# Patient Record
Sex: Female | Born: 1980 | Race: White | Hispanic: Yes | Marital: Married | Smoking: Never smoker
Health system: Southern US, Community
[De-identification: ages and names within clinical notes are randomized; demographics above are authoritative.]

## PROBLEM LIST (undated history)

## (undated) DIAGNOSIS — G43009 Migraine without aura, not intractable, without status migrainosus: Secondary | ICD-10-CM

## (undated) HISTORY — DX: Migraine without aura, not intractable, without status migrainosus: G43.009

## (undated) HISTORY — PX: TUBAL LIGATION: SHX77

---

## 2004-10-22 ENCOUNTER — Emergency Department (HOSPITAL_COMMUNITY): Admission: EM | Admit: 2004-10-22 | Discharge: 2004-10-22 | Payer: Self-pay | Admitting: Family Medicine

## 2004-10-24 ENCOUNTER — Emergency Department (HOSPITAL_COMMUNITY): Admission: EM | Admit: 2004-10-24 | Discharge: 2004-10-24 | Payer: Self-pay | Admitting: Family Medicine

## 2004-11-27 ENCOUNTER — Other Ambulatory Visit: Admission: RE | Admit: 2004-11-27 | Discharge: 2004-11-27 | Payer: Self-pay | Admitting: Obstetrics and Gynecology

## 2005-05-22 ENCOUNTER — Encounter: Admission: RE | Admit: 2005-05-22 | Discharge: 2005-05-22 | Payer: Self-pay | Admitting: Obstetrics and Gynecology

## 2005-06-03 ENCOUNTER — Inpatient Hospital Stay (HOSPITAL_COMMUNITY): Admission: AD | Admit: 2005-06-03 | Discharge: 2005-06-07 | Payer: Self-pay | Admitting: Obstetrics & Gynecology

## 2005-06-03 ENCOUNTER — Encounter (INDEPENDENT_AMBULATORY_CARE_PROVIDER_SITE_OTHER): Payer: Self-pay | Admitting: *Deleted

## 2006-02-12 ENCOUNTER — Emergency Department (HOSPITAL_COMMUNITY): Admission: EM | Admit: 2006-02-12 | Discharge: 2006-02-12 | Payer: Self-pay | Admitting: Family Medicine

## 2006-02-12 ENCOUNTER — Ambulatory Visit (HOSPITAL_COMMUNITY): Admission: RE | Admit: 2006-02-12 | Discharge: 2006-02-12 | Payer: Self-pay | Admitting: Family Medicine

## 2007-04-13 ENCOUNTER — Ambulatory Visit (HOSPITAL_COMMUNITY): Admission: RE | Admit: 2007-04-13 | Discharge: 2007-04-13 | Payer: Self-pay | Admitting: Obstetrics and Gynecology

## 2007-04-13 ENCOUNTER — Encounter (INDEPENDENT_AMBULATORY_CARE_PROVIDER_SITE_OTHER): Payer: Self-pay | Admitting: Obstetrics and Gynecology

## 2008-01-24 ENCOUNTER — Emergency Department (HOSPITAL_COMMUNITY): Admission: EM | Admit: 2008-01-24 | Discharge: 2008-01-24 | Payer: Self-pay | Admitting: Family Medicine

## 2008-02-09 ENCOUNTER — Emergency Department (HOSPITAL_COMMUNITY): Admission: EM | Admit: 2008-02-09 | Discharge: 2008-02-09 | Payer: Self-pay | Admitting: Emergency Medicine

## 2008-11-24 ENCOUNTER — Ambulatory Visit: Payer: Self-pay | Admitting: Family Medicine

## 2008-11-24 DIAGNOSIS — E669 Obesity, unspecified: Secondary | ICD-10-CM

## 2008-11-24 DIAGNOSIS — R519 Headache, unspecified: Secondary | ICD-10-CM | POA: Insufficient documentation

## 2008-11-24 DIAGNOSIS — Z87442 Personal history of urinary calculi: Secondary | ICD-10-CM | POA: Insufficient documentation

## 2008-11-24 DIAGNOSIS — R51 Headache: Secondary | ICD-10-CM | POA: Insufficient documentation

## 2008-11-24 DIAGNOSIS — K589 Irritable bowel syndrome without diarrhea: Secondary | ICD-10-CM | POA: Insufficient documentation

## 2008-11-24 DIAGNOSIS — Z8742 Personal history of other diseases of the female genital tract: Secondary | ICD-10-CM | POA: Insufficient documentation

## 2008-11-25 DIAGNOSIS — E7800 Pure hypercholesterolemia, unspecified: Secondary | ICD-10-CM

## 2008-11-25 LAB — CONVERTED CEMR LAB
ALT: 18 units/L (ref 0–35)
AST: 18 units/L (ref 0–37)
Alkaline Phosphatase: 83 units/L (ref 39–117)
Bilirubin, Direct: 0 mg/dL (ref 0.0–0.3)
Chloride: 106 meq/L (ref 96–112)
GFR calc non Af Amer: 125.81 mL/min (ref >60)
Potassium: 4.3 meq/L (ref 3.5–5.1)
Sodium: 140 meq/L (ref 135–145)
TSH: 1.98 microintl units/mL (ref 0.35–5.50)
Total CHOL/HDL Ratio: 5
Total Protein: 7.2 g/dL (ref 6.0–8.3)
Triglycerides: 151 mg/dL — ABNORMAL HIGH (ref 0.0–149.0)
VLDL: 30.2 mg/dL (ref 0.0–40.0)

## 2008-12-05 ENCOUNTER — Telehealth: Payer: Self-pay | Admitting: Family Medicine

## 2009-04-19 ENCOUNTER — Ambulatory Visit: Payer: Self-pay | Admitting: Family Medicine

## 2009-04-19 DIAGNOSIS — B353 Tinea pedis: Secondary | ICD-10-CM

## 2009-04-19 DIAGNOSIS — L258 Unspecified contact dermatitis due to other agents: Secondary | ICD-10-CM | POA: Insufficient documentation

## 2009-04-19 LAB — CONVERTED CEMR LAB: KOH Prep: POSITIVE

## 2009-07-27 ENCOUNTER — Ambulatory Visit: Payer: Self-pay | Admitting: Internal Medicine

## 2009-08-04 ENCOUNTER — Ambulatory Visit: Payer: Self-pay | Admitting: Family Medicine

## 2009-08-17 ENCOUNTER — Encounter: Payer: Self-pay | Admitting: Family Medicine

## 2009-08-17 ENCOUNTER — Ambulatory Visit (HOSPITAL_COMMUNITY): Admission: RE | Admit: 2009-08-17 | Discharge: 2009-08-17 | Payer: Self-pay | Admitting: Family Medicine

## 2009-08-17 ENCOUNTER — Ambulatory Visit: Payer: Self-pay | Admitting: Internal Medicine

## 2009-08-17 ENCOUNTER — Ambulatory Visit: Payer: Self-pay

## 2009-12-29 ENCOUNTER — Ambulatory Visit: Payer: Self-pay | Admitting: Family Medicine

## 2009-12-29 DIAGNOSIS — R5383 Other fatigue: Secondary | ICD-10-CM

## 2009-12-29 DIAGNOSIS — R42 Dizziness and giddiness: Secondary | ICD-10-CM | POA: Insufficient documentation

## 2009-12-29 DIAGNOSIS — R110 Nausea: Secondary | ICD-10-CM

## 2009-12-29 DIAGNOSIS — R5381 Other malaise: Secondary | ICD-10-CM | POA: Insufficient documentation

## 2009-12-29 LAB — CONVERTED CEMR LAB
Bilirubin Urine: NEGATIVE
Glucose, Urine, Semiquant: NEGATIVE
Nitrite: NEGATIVE
Urobilinogen, UA: 0.2

## 2010-01-05 LAB — CONVERTED CEMR LAB
Albumin: 4.2 g/dL (ref 3.5–5.2)
Alkaline Phosphatase: 77 units/L (ref 39–117)
Basophils Absolute: 0.1 10*3/uL (ref 0.0–0.1)
Basophils Relative: 1.2 % (ref 0.0–3.0)
CO2: 29 meq/L (ref 19–32)
Chloride: 103 meq/L (ref 96–112)
Creatinine, Ser: 0.5 mg/dL (ref 0.4–1.2)
Eosinophils Absolute: 0.7 10*3/uL (ref 0.0–0.7)
Eosinophils Relative: 8.6 % — ABNORMAL HIGH (ref 0.0–5.0)
MCHC: 34.2 g/dL (ref 30.0–36.0)
MCV: 92.9 fL (ref 78.0–100.0)
Neutrophils Relative %: 55.2 % (ref 43.0–77.0)
Potassium: 4 meq/L (ref 3.5–5.1)
WBC: 8 10*3/uL (ref 4.5–10.5)

## 2010-09-27 NOTE — Assessment & Plan Note (Signed)
Summary: DIZZY,NAUSEA/CLE   Vital Signs:  Patient profile:   30 year old female Height:      67 inches Weight:      198.25 pounds BMI:     31.16 Temp:     98.3 degrees F oral Pulse rate:   68 / minute Pulse rhythm:   regular BP sitting:   102 / 80  (left arm) Cuff size:   regular  Vitals Entered By: Linde Gillis CMA Duncan Dull) (Dec 29, 2009 9:54 AM) CC: dizzy, nausea   History of Present Illness: In last month.Marland Kitchenepisodes intermittant of  nausea, dizzyness, not always at the same time. Nausea radom.Marland Kitchenocc in bed when turning head, or bending over. Describes dizzyness as lightheadedness, biut occ room spinning. No association with eating. BP nml at checks.Marland Kitchenalways runs low. no orthostatic component. No neuro changes..weakness, numbness  no allergies, no cold sympotms. Rare headaches...associated most often with premenstrual time, chronic...? menstrual migraine.   Overall fatigue constant in last month.  S/P BTL, has had regualr menses.  no new med, vitamins.   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  Past Surgical History: Reviewed history from 11/24/2008 and no changes required. 2008 BTL, uterine ablation with Therma  choice  NSVD x 2 , emergency C-section for prolapsed cord  Family History: Reviewed history from 11/24/2008 and no changes required. mother: HTN, DM., high chol, hypothyroid fathter: healthy 5 siblings: sister: DM, hypothyroid, sister: DM, brain tumor on pituitary MGM: pancreatic cancer age 76 no CAD less than age 72  Social History: Reviewed history from 11/24/2008 and no changes required. 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 CV:  Denies chest pain or discomfort. Resp:  Denies shortness of breath. GI:  Denies abdominal pain, bloody stools, constipation, and diarrhea; occ history of IBS...but not associated with nausea spells. . GU:  Denies abnormal vaginal bleeding and dysuria.  Physical Exam  General:  Well-developed,well-nourished,in no acute distress; alert,appropriate and cooperative throughout examination Head:  no maxillary sinus ttp Eyes:  No corneal or conjunctival inflammation noted. EOMI. Perrla. Funduscopic exam benign, without hemorrhages, exudates or papilledema. Vision grossly normal. Ears:  External ear exam shows no significant lesions or deformities.  Otoscopic examination reveals clear canals, tympanic membranes are intact bilaterally without bulging, retraction, inflammation or discharge. Hearing is grossly normal bilaterally. Nose:  External nasal examination shows no deformity or inflammation. Nasal mucosa are pink and moist without lesions or exudates. Mouth:  Oral mucosa and oropharynx without lesions or exudates.  Teeth in good repair. Neck:  no carotid bruit or thyromegaly no cervical or supraclavicular lymphadenopathy  Lungs:  Normal respiratory effort, chest expands symmetrically. Lungs are clear to auscultation, no crackles or wheezes. Heart:  Normal  rate and regular rhythm. S1 and S2 normal without gallop, murmur, click, rub or other extra sounds. Abdomen:  Bowel sounds positive,abdomen soft and non-tender without masses, organomegaly or hernias noted. Pulses:  R and L posterior tibial pulses are full and equal bilaterally  Extremities:  no edema  Neurologic:  No cranial nerve deficits noted. Station and gait are normal.  Sensory, motor and coordinative functions appear intact.   Impression & Recommendations:  Problem # 1:  NAUSEA (ICD-787.02) Eval with labs for thyroid, anemia, liver and kidney issues.Marland Kitchenalso consider DM given past history during pregnancy and other symptoms.   Orders: UA Dipstick w/o Micro (manual) (16109) Urine Pregnancy Test  (60454)  Problem # 2:  MALAISE AND FATIGUE (ICD-780.79)  Orders: TLB-BMP (Basic Metabolic Panel-BMET) (80048-METABOL) TLB-CBC Platelet - w/Differential (85025-CBCD) TLB-Hepatic/Liver Function Pnl (80076-HEPATIC) TLB-B12 + Folate Pnl (09811_91478-G95/AOZ) TLB-TSH (Thyroid Stimulating Hormone) (84443-TSH)  Problem # 3:  DIZZINESS (ICD-780.4)  Some element of vertigo..but also describes as lightheaded. Eval with labs as previously stated.    Orders: TLB-B12 + Folate Pnl (82746_82607-B12/FOL)  Prior Medications (reviewed today): None Current Allergies (reviewed today): No known allergies   Laboratory Results   Urine Tests  Date/Time Received: Dec 29, 2009 10:43 AM   Routine Urinalysis   Color: yellow Appearance: Hazy Glucose: negative   (Normal Range: Negative) Bilirubin: negative   (Normal Range: Negative) Ketone: negative   (Normal Range: Negative) Spec. Gravity: 1.025   (Normal Range: 1.003-1.035) Blood: moderate   (Normal Range: Negative) pH: 6.0   (Normal Range: 5.0-8.0) Protein: trace   (Normal Range: Negative) Urobilinogen: 0.2   (Normal Range: 0-1) Nitrite: negative   (Normal Range: Negative) Leukocyte Esterace: negative   (Normal Range: Negative)    Urine  HCG: negative

## 2011-01-08 NOTE — Op Note (Signed)
NAME:  Erica Singh     ACCOUNT NO.:  [id number]   MEDICAL RECORD NO.:  [id number]          PATIENT TYPE:  AMB   LOCATION:  SDC                           FACILITY:  [facility]   PHYSICIAN:  Dineen Kid. Rana Snare, M.D.    DATE OF BIRTH:  June 05, 1981   DATE OF PROCEDURE:  04/13/2007  DATE OF DISCHARGE:                               OPERATIVE REPORT   PREOPERATIVE DIAGNOSIS:  Multiparity with desired sterility and  menorrhagia.   POSTOPERATIVE DIAGNOSIS:  Multiparity with desired sterility and  menorrhagia.   PROCEDURE:  Laparoscopic bilateral tubal ligation by fulguration and  hysteroscopy D&C with ThermaChoice endometrial ablation.   SURGEON:  Dr. Candice Camp   ANESTHESIA:  General endotracheal.   INDICATIONS:  The patient was a 30 year old with multiparity, desires  sterility.  She presented for office Essure procedure, easily placed the  right to Essure. Left fallopian tube was unable to be placed. She  returned a month later for second attempt and unable to pass through the  second attempt. She desires definitive surgical intervention and  presents today for laparoscopic tubal ligation.  She has been having  problems with menorrhagia, which had benefitted only somewhat by being  on birth control pills and now no longer needs to take birth control  pills.  She desires endometrial ablation technique. Discussed the  different types of techniques, discussed the fact that she has Essure  placed in the right fallopian tube that the device needs to be  compatible with that such as ThermaChoice. She has read the handout and  questions were answered. The success rates complication rates were  discussed. The risks, benefits and procedures were discussed which  include but not limited to risk of infection, bleeding, damage to tubes,  ovary, bowel, bladder, risk of tubal failure quoted at 5 out of 1000.  She gives informed consent and wished to proceed.   FINDINGS:  Normal appendix, liver,  uterus, fallopian tubes, ovaries and  cul-de-sac. On D&C, normal-appearing endometrial cavity.   DESCRIPTION OF PROCEDURE:  After adequate analgesia the patient placed  in the dorsal lithotomy position.  She is sterilely prepped and draped.  Bladder sterilely drained. A Cohen tenaculum placed on the cervix.  Legs  were repositioned and infraumbilical skin incision was made.  A Veress  needle was inserted.  The abdomen was insufflated to dullness to  percussion. An 11 mm trocar was inserted.  The above findings were  noted.  The right fallopian tube was identified by the fimbriated end.  Midportion fallopian tube was grasped with bipolar cautery and was  cauterized over 2-3 cm section of fallopian tube with good thermal  cautery noted over the entirety of the portion of fallopian tube and  also loss of resistance by the ohmmeter. The left fallopian tube was  identified by the fimbriated end.  Midportion of fallopian tube was  grasped.  Cauterization was carried out over approximately 2-3 cm  section of fallopian tube with good thermal burn noted and loss-of-  resistance the ohmmeter.  Reexamination of both fallopian tubes revealed  good thermal cautery.  After careful systematic evaluation of pelvis and  abdomen and everything  appeared normal.  The abdomen was desufflated.  Trocar removed.  The infraumbilical skin incision was closed with 0  Vicryl interrupted suture and the fascia, 3-0 Vicryl Rapide subcuticular  suture.  The incision was injected with 0.25% Marcaine total of 10 mL  used.  Legs were repositioned and tenaculum placed on the cervix.  Uterus sounded 9 cm easily dilated to #27 [facility] dilator.  Hysteroscope  was then inserted, the above findings were noted.  Curettage was  performed.  The right Essure appeared to be appropriately placed, left  ostia appeared to be normal but without obvious Essure. After curettage  of the ThermaChoice device was properly prepped and inserted  into the  cavity. Approximately 20 mL of fluid were used to insert and fill  ThermaChoice to a pressure of 180. Warming cycle was carried out and  then a 8-minute ThermaChoice procedure at 87 degrees centigrade. After  the cooling cycle the device was removed.  Reexamination of the  hysteroscope revealed, good thermal burn, no obvious complications  noted.  A gentle curettage performed retrieving small fragments of  denuded endometrium.  Minimal bleeding was noted, tenaculum was removed  from the cervix and no bleeding was noted. The patient tolerated  procedure well and was stable on transfer to recovery room. Sponge and  needle count was normal x3.  Estimated blood loss was minimal.  Sorbitol  deficit 50 mL.  The patient received 1 gram of cefotetan preoperatively  and 30 mg Toradol postoperatively.   DISPOSITION:  The patient discharged home to be followed in office in 2  to 3 weeks with routine instruction sheet for laparoscopic tubal and  also D&C.  She was told to return for increased pain, fever or bleeding.  She had prescription for Percocet #20.      Dineen Kid Rana Snare, M.D.  Electronically Signed     DCL/MEDQ  D:  04/13/2007  T:  04/13/2007  Job:  [id number]

## 2011-01-11 NOTE — Discharge Summary (Signed)
NAMEMarland Kitchen  Erica Singh     ACCOUNT NO.:  [id number]   MEDICAL RECORD NO.:  [id number]          PATIENT TYPE:  INP   LOCATION:  9315                          FACILITY:  [facility]   PHYSICIAN:  Ilda Mori, M.D.   DATE OF BIRTH:  02-09-1981   DATE OF ADMISSION:  06/03/2005  DATE OF DISCHARGE:  06/07/2005                                 DISCHARGE SUMMARY   FINAL DIAGNOSES:  1.  Intrauterine pregnancy at 24 weeks' gestation.  2.  Prolapsed umbilical cord.  3.  Backup transverse lie.   PROCEDURE:  Primary low transverse cesarean section with vertical extension  superiorly and delivery of an internal cephalic version. Surgeon:  Dr.  Ilda Mori.  Assistant:  Dr. Colin Broach.  Complications:  None.   This 30 year old, G3, P1-0-1-1, presented to triage at 82 weeks' gestation  complaining of bleeding earlier that day with some cramping and then  followed by gush of amniotic fluid on arrival to maternity admissions.  At  the time of evaluation, fetal heart tones showed some irregularity with some  what appeared to be a deceleration.  On speculum exam, she was noted to have  ruptured membranes and exam revealed multiple loops of cord in the vagina.  At this point,  preparation was made for emergency STAT cesarean section.  The patient's antepartum course otherwise has been uncomplicated.  She was taken to the operating room, on June 07, 2005, by Dr. Ilda Mori, where an emergency primary low transverse cesarean section was  performed with a vertical extension superiorly.  The patient had the  delivery of a 4-pound 14-ounce female infant with Apgar's of 2, 7, and 7.  The  baby was delivered alive with an internal cephalic version.  to get the baby  out.  Cord blood was obtained with a pH of 7.18.  The delivery had gone  without complication.  The patient's postoperative course was complicated by some postoperative  anemia for which she was started on iron during her hospital  stay.  The baby  was in the NICU and was doing well.  She was felt ready for discharge on  postoperative day #4.   Was sent home on a regular diet.   Told to decrease her activities.   1.  Told to continue her prenatal vitamins.  2.  Iron supplement daily.  3.  Was given Tylox, #15, one to two every four hours as needed for pain.  4.  Told she could use over-the-counter ibuprofen up to 600 mg every six      hours as needed for pain.   Was to follow up in our office in four weeks.  Of course, to call with any  increased bleeding, pain, or fevers.  Infant, like I said, was doing well in  the NICU.   DISCHARGE LABORATORY:  The patient had a hemoglobin of 8.4, white blood cell  count of 17.9, platelets of 194,000.      Erica Singh, P.A.-C.      Ilda Mori, M.D.  Electronically Signed    MB/MEDQ  D:  07/03/2005  T:  07/03/2005  Job:  [id number]

## 2011-01-11 NOTE — Op Note (Signed)
NAMEMarland Singh  Gloris Ham     ACCOUNT NO.:  [id number]   MEDICAL RECORD NO.:  [id number]          PATIENT TYPE:  INP   LOCATION:  9315                          FACILITY:  [facility]   PHYSICIAN:  Ilda Mori, M.D.   DATE OF BIRTH:  16-Sep-1980   DATE OF PROCEDURE:  06/03/2005  DATE OF DISCHARGE:                                 OPERATIVE REPORT   PREOPERATIVE DIAGNOSIS:  Prolapsed umbilical cord, 33-week gestation.   POSTOPERATIVE DIAGNOSIS:  Prolapsed umbilical cord, 33-week gestation. Back-  up transverse lie.   PROCEDURE:  Primary low transverse cesarean section with vertical extension  superiorly (inverted T), and delivery of living 33-week female infant and  internal cephalic version.   SURGEON:  Ilda Mori, M.D.   ASSISTANT:  Nadyne Coombes. Fontaine, M.D.   ANESTHESIA:  Was general endotracheal.   ESTIMATED BLOOD LOSS:  Was 1000 mL.   FINDINGS:  The infant was female. Weight 5 pounds. Apgar scores 2 and 7 and 7  and 1, 5 and 10 minutes respectively. The placenta appeared normal. Tubes  and ovaries appeared normal. The baby was in a back-up transverse lie.   INDICATIONS:  This is a 30 year old gravida 3, para 1-0-1-1 with estimated  confinement of July 22, 2005 putting her at [redacted] weeks gestation who  presented to triage with a history of bleeding earlier that day with cramps  followed by a gush of amniotic fluid on arrival at the MAU. At that time  evaluation fetal heart tones showed some irregularity of the fetal heart  beat with some what appeared to be deep decelerations. On evaluation  speculum examination showed grossly ruptured membranes. A sterile vaginal  exam revealed multiple loops of cord in the vagina. At this point  preparation was made for emergency cesarean section. The bladder was  catheterized and filled with 380 mL of saline to decrease the possibility of  pressure on the cord. The patient was then brought to the operating for  emergent cesarean.   PROCEDURE:  The patient was moved to the operating table with a full bladder  and the abdomen was prepped and draped in sterile fashion and preparations  made for an emergency induction of anesthesia. At this point the Foley was  unclamped so that the bladder would be emptied for surgery. The general  anesthesia was induced and a low transverse incision was made, carried down  to the fascia which was extended transversely. The anterior rectus sheath  was grasped with Kocher clamps, dissected free from the underlying rectus  muscle and rectus muscles divided in midline. The perineum was identified,  entered sharply, extended bluntly. Lower segment identified. Incision was  made and the incision was carried down to the lower segment which was then  extended bluntly. The cord protruded first through the incision followed by  a hand. The hand and arm were replaced in the incision and the uterus was  explored. The vertex was in the right upper quadrant and the breech was in  the left upper quadrant. Attempts were made to bring the vertex down, rotate  the vertex down into the lower segment. This was very difficult to do. The  attempt was then made to find the breech and divert that down into the lower  segment. That was also difficult to do. Finally attempt was once again made  to bring the vertex down into lower segment and this eventually was  successful with the delivery of the infant in the vertex presentation. The  baby was passed to the awaiting pediatricians who resuscitated the baby.  Apgar scores were 2, 7, and 7. Cord blood was obtained and the pH was 7.18.  The baby during the attempted delivery was exposed to general anesthesia and  it is possible that this contributed to the low 1 minute Apgar. The placenta  was then delivered bluntly. The uterus was bluntly curettaged. Please note  that during the delivery at the attempts to do the version, the uterine  incision was extended with a  vertical T in a superior fashion so that the  incision was an inverted T incision. It was after this extension that the  vertex was finally brought into the lower segment and delivered. The uterus  was closed. The T portion of the uterus was closed with two layers of  running interlocking Vicryl one suture. The serosa over the T was closed  with a baseball suture also of #1 chromic. The transverse portion of the  lower segment was closed with a running interlocking 0 Vicryl suture. The  bladder plane was reapposed with 3-0 Vicryl suture. The uterus had been  delivered from the peritoneal cavity for the repair and was now replaced  into the peritoneal cavity. The peritoneum was identified and closed with  running 3-0 Vicryl suture. The rectus muscles reapposed in the midline with  a running 3-0 Vicryl suture. The fascia was closed with running 0 Vicryl  suture and the skin was closed with staples. The patient tolerated procedure  well and left the operative room in good condition.      Ilda Mori, M.D.  Electronically Signed     RK/MEDQ  D:  06/03/2005  T:  06/03/2005  Job:  [id number]

## 2011-06-07 LAB — CBC
Hemoglobin: 11.4 — ABNORMAL LOW
MCHC: 34.1
MCV: 81.4
Platelets: 370
RBC: 4.11
RDW: 16 — ABNORMAL HIGH
WBC: 8

## 2012-04-14 ENCOUNTER — Ambulatory Visit: Payer: Self-pay | Admitting: Emergency Medicine

## 2012-10-30 ENCOUNTER — Ambulatory Visit: Payer: Self-pay | Admitting: Family Medicine

## 2012-11-03 ENCOUNTER — Ambulatory Visit (INDEPENDENT_AMBULATORY_CARE_PROVIDER_SITE_OTHER): Payer: BC Managed Care – PPO | Admitting: Family Medicine

## 2012-11-03 ENCOUNTER — Encounter: Payer: Self-pay | Admitting: Family Medicine

## 2012-11-03 VITALS — BP 90/60 | HR 101 | Temp 99.0°F | Ht 67.0 in | Wt 197.8 lb

## 2012-11-03 DIAGNOSIS — R002 Palpitations: Secondary | ICD-10-CM

## 2012-11-03 DIAGNOSIS — R5381 Other malaise: Secondary | ICD-10-CM

## 2012-11-03 DIAGNOSIS — I499 Cardiac arrhythmia, unspecified: Secondary | ICD-10-CM

## 2012-11-03 LAB — POCT URINE PREGNANCY: Preg Test, Ur: NEGATIVE

## 2012-11-03 NOTE — Progress Notes (Signed)
  Subjective:    Patient ID: Erica Singh, female    DOB: 17-Oct-1980, 32 y.o.   MRN: [id number]  HPI  32 year old female with history of palpitations while pregnant  and once in 10/2013years ago comes to clinic today with intermittant fluttering/ skipping of heart. Makes her catch her breath. Last few seconds This is now much frequent now, 4-5 times a day. No specific triggers. No CP, no SOB, she has noted some fatigue and mild DOE.  Occ dizziness when leaning over and standing up.  Nausea in AMs  BP usually 110/60-70. She has history of milf tachycardia in past at baseline.   Only occassional caffeine use.  On no meds. No decongestants or stimulants.  She has had BTL... Last menses somewhat lighter than usual.  Sexually active. No abdominal pain.   FH: uncle with cardiomegaly (she has had nml ECHO 2010).  Mother with low thyroid.   Review of Systems  Constitutional: Positive for fatigue. Negative for fever.  HENT: Negative for ear pain.   Eyes: Negative for pain.  Respiratory: Negative for chest tightness and shortness of breath.   Cardiovascular: Positive for palpitations. Negative for chest pain and leg swelling.  Gastrointestinal: Negative for abdominal pain.  Genitourinary: Negative for dysuria.       Objective:   Physical Exam  Constitutional: Vital signs are normal. She appears well-developed and well-nourished. She is cooperative.  Non-toxic appearance. She does not appear ill. No distress.  HENT:  Head: Normocephalic.  Right Ear: Hearing, tympanic membrane, external ear and ear canal normal. Tympanic membrane is not erythematous, not retracted and not bulging.  Left Ear: Hearing, tympanic membrane, external ear and ear canal normal. Tympanic membrane is not erythematous, not retracted and not bulging.  Nose: No mucosal edema or rhinorrhea. Right sinus exhibits no maxillary sinus tenderness and no frontal sinus tenderness. Left sinus exhibits no maxillary  sinus tenderness and no frontal sinus tenderness.  Mouth/Throat: Uvula is midline, oropharynx is clear and moist and mucous membranes are normal.  Eyes: Conjunctivae, EOM and lids are normal. Pupils are equal, round, and reactive to light. No foreign bodies found.  Neck: Trachea normal and normal range of motion. Neck supple. Carotid bruit is not present. No mass and no thyromegaly present.  Cardiovascular: Normal rate, regular rhythm, S1 normal, S2 normal, normal heart sounds, intact distal pulses and normal pulses.  Exam reveals no gallop and no friction rub.   No murmur heard. Pulmonary/Chest: Effort normal and breath sounds normal. Not tachypneic. No respiratory distress. She has no decreased breath sounds. She has no wheezes. She has no rhonchi. She has no rales.  Abdominal: Soft. Normal appearance and bowel sounds are normal. There is no tenderness.  Neurological: She is alert.  Skin: Skin is warm, dry and intact. No rash noted.  Psychiatric: Her speech is normal and behavior is normal. Judgment and thought content normal. Her mood appears not anxious. Cognition and memory are normal. She does not exhibit a depressed mood.          Assessment & Plan:

## 2012-11-03 NOTE — Assessment & Plan Note (Signed)
No clear triggers. Will eval with labs. Minimal caffeine use.   Given occuring frequently and symptomatic will refer to cardiology for likely Holter and BBlocker initiation.

## 2012-11-03 NOTE — Assessment & Plan Note (Signed)
Eval with labs. 

## 2012-11-03 NOTE — Patient Instructions (Addendum)
We will call you with lab results. Stop to see Shirlee Limerick to set up Cardiology referral for Holter Monitor.

## 2012-11-04 LAB — CBC WITH DIFFERENTIAL/PLATELET
Basophils Relative: 0.4 % (ref 0.0–3.0)
HCT: 39.6 % (ref 36.0–46.0)
Lymphocytes Relative: 23 % (ref 12.0–46.0)
MCV: 91.3 fl (ref 78.0–100.0)
Monocytes Absolute: 0.3 10*3/uL (ref 0.1–1.0)
RBC: 4.33 Mil/uL (ref 3.87–5.11)
RDW: 13.6 % (ref 11.5–14.6)
WBC: 7.6 10*3/uL (ref 4.5–10.5)

## 2012-11-04 LAB — TSH: TSH: 2.13 u[IU]/mL (ref 0.35–5.50)

## 2012-11-04 NOTE — Addendum Note (Signed)
Addended by: Baldomero Lamy on: 11/04/2012 09:23 AM   Modules accepted: Orders

## 2012-11-24 ENCOUNTER — Ambulatory Visit: Payer: BC Managed Care – PPO | Admitting: Cardiovascular Disease

## 2012-12-04 ENCOUNTER — Encounter: Payer: Self-pay | Admitting: Cardiovascular Disease

## 2012-12-04 ENCOUNTER — Ambulatory Visit (INDEPENDENT_AMBULATORY_CARE_PROVIDER_SITE_OTHER): Payer: BC Managed Care – PPO | Admitting: Cardiovascular Disease

## 2012-12-04 VITALS — BP 100/72 | HR 100 | Ht 67.0 in | Wt 190.5 lb

## 2012-12-04 DIAGNOSIS — R002 Palpitations: Secondary | ICD-10-CM

## 2012-12-04 DIAGNOSIS — I951 Orthostatic hypotension: Secondary | ICD-10-CM

## 2012-12-04 DIAGNOSIS — G90A Postural orthostatic tachycardia syndrome (POTS): Secondary | ICD-10-CM | POA: Insufficient documentation

## 2012-12-04 DIAGNOSIS — R0602 Shortness of breath: Secondary | ICD-10-CM

## 2012-12-04 DIAGNOSIS — R000 Tachycardia, unspecified: Secondary | ICD-10-CM

## 2012-12-04 NOTE — Assessment & Plan Note (Signed)
The patient's heart rate increased from 73 beats per minute in the supine position to 100 beats per minute in the standing position. Blood pressure dropped only by 10 mm mercury. She likely has postural orthostatic tachycardia syndrome. However, she also complains of resting tachycardia and there is a possibility of inappropriate sinus tachycardia. Her recent labs have been unremarkable including thyroid function. She does not consume excessive amount of caffeine. I will request a 48-hour Holter monitor to make sure that there is no other associated arrhythmia. I advised her to increase fluid and sodium intake and avoid sudden standing up. Treatment with a small dose beta blocker can be considered if initial changes do not work. This might be somewhat challenging given that her blood pressure tends to run low.

## 2012-12-04 NOTE — Progress Notes (Signed)
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 Ermalene Searing for evaluation of palpitations. The patient is not aware of any previous cardiac history. There is no history of congenital heart disease or rheumatic fever. It appears that her uncle had hypertrophic cardiomyopathy. She was screened with an echocardiogram in 2010 which overall was normal. She has been having palpitations for many years especially with change in position. She had few episodes in the last month while she was driving. She felt her heart was going fast. There has been no syncope or presyncope. She feels that her resting heart rate is always somewhat fast. She exercises 3 times a week on a treadmill. Heart rate goes up quickly to 130 beats per minute and frequently reaches 180 beats per minute within a few minutes. She denies any chest pain or significant dyspnea.  No Known Allergies   No current outpatient prescriptions on file prior to visit.   No current facility-administered medications on file prior to visit.     History reviewed. No pertinent past medical history.   Past Surgical History  Procedure Laterality Date  . Cesarean section  2006    Emergency  . Tubal ligation       Family History  Problem Relation Age of Onset  . Hyperlipidemia Mother   . Heart disease Mother     cardiac Cath showed 40 % blockage  . Hyperlipidemia Father      History   Social History  . Marital Status: Married    Spouse Name: N/A    Number of Children: N/A  . Years of Education: N/A   Occupational History  . Not on file.   Social History Main Topics  . Smoking status: Never Smoker   . Smokeless tobacco: Not on file  . Alcohol Use: No  . Drug Use: No  . Sexually Active: Not on file   Other Topics Concern  . Not on file   Social History Narrative  . No narrative on file     ROS Constitutional: Negative for fever, chills, diaphoresis, activity change, appetite change and fatigue.  HENT:  Negative for hearing loss, nosebleeds, congestion, sore throat, facial swelling, drooling, trouble swallowing, neck pain, voice change, sinus pressure and tinnitus.  Eyes: Negative for photophobia, pain, discharge and visual disturbance.  Respiratory: Negative for apnea, cough, chest tightness, shortness of breath and wheezing.  Cardiovascular: Negative for chest pain and leg swelling.  Gastrointestinal: Negative for nausea, vomiting, abdominal pain, diarrhea, constipation, blood in stool and abdominal distention.  Genitourinary: Negative for dysuria, urgency, frequency, hematuria and decreased urine volume.  Musculoskeletal: Negative for myalgias, back pain, joint swelling, arthralgias and gait problem.  Skin: Negative for color change, pallor, rash and wound.  Neurological: Negative for dizziness, tremors, seizures, syncope, speech difficulty, weakness, light-headedness, numbness and headaches.  Psychiatric/Behavioral: Negative for suicidal ideas, hallucinations, behavioral problems and agitation. The patient is not nervous/anxious.     PHYSICAL EXAM   BP 100/72  Pulse 100  Ht 5\' 7"  (1.702 m)  Wt 190 lb 8 oz (86.41 kg)  BMI 29.83 kg/m2 Constitutional: She is oriented to person, place, and time. She appears well-developed and well-nourished. No distress.  HENT: No nasal discharge.  Head: Normocephalic and atraumatic.  Eyes: Pupils are equal and round. Right eye exhibits no discharge. Left eye exhibits no discharge.  Neck: Normal range of motion. Neck supple. No JVD present. No thyromegaly present.  Cardiovascular: Normal rate, regular rhythm, normal heart sounds.  Exam reveals no gallop and no friction rub. No murmur heard.  Pulmonary/Chest: Effort normal and breath sounds normal. No stridor. No respiratory distress. She has no wheezes. She has no rales. She exhibits no tenderness.  Abdominal: Soft. Bowel sounds are normal. She exhibits no distension. There is no tenderness. There is no  rebound and no guarding.  Musculoskeletal: Normal range of motion. She exhibits no edema and no tenderness.  Neurological: She is alert and oriented to person, place, and time. Coordination normal.  Skin: Skin is warm and dry. No rash noted. She is not diaphoretic. No erythema. No pallor.  Psychiatric: She has a normal mood and affect. Her behavior is normal. Judgment and thought content normal.     EKG: Sinus  Rhythm  WITHIN NORMAL LIMITS   ASSESSMENT AND PLAN

## 2012-12-04 NOTE — Patient Instructions (Addendum)
Your physician has recommended that you wear a holter monitor. Holter monitors are medical devices that record the heart's electrical activity. Doctors most often use these monitors to diagnose arrhythmias. Arrhythmias are problems with the speed or rhythm of the heartbeat. The monitor is a small, portable device. You can wear one while you do your normal daily activities. This is usually used to diagnose what is causing palpitations/syncope (passing out).  Increase fluid and salt intake.   You likely has a condition called : Postural Orthostatic Tachycardia Syndrome (POTS).   Follow up as needed.

## 2012-12-18 ENCOUNTER — Telehealth: Payer: Self-pay | Admitting: *Deleted

## 2012-12-18 NOTE — Telephone Encounter (Signed)
Spoke with Erica Singh and she mentioned that holter monitor has been placed. Awaiting results.

## 2012-12-18 NOTE — Telephone Encounter (Signed)
Lmtcb regarding 48 hour holter monitor.

## 2013-01-04 ENCOUNTER — Telehealth: Payer: Self-pay | Admitting: *Deleted

## 2013-01-04 NOTE — Telephone Encounter (Signed)
lmtcb; Spoke with Seward Speck from [employer] and she mentioned that pt has not returned monitor and that she has been unsuccessful in reaching pt. Monitor was placed back in March.

## 2013-01-06 NOTE — Telephone Encounter (Signed)
x2 lmtcb regarding holter monitor. Holter monitor has not been returned to [facility] for results.

## 2013-01-29 ENCOUNTER — Telehealth: Payer: Self-pay

## 2013-01-29 NOTE — Telephone Encounter (Signed)
Dr. Kirke Corin reviewed holter monitor: "NSR. No significant arrythmia. Normal study." V.O. Dr. Adline Mango, RN, BSN

## 2013-01-29 NOTE — Telephone Encounter (Signed)
Pt informed of HM results. 

## 2013-01-29 NOTE — Telephone Encounter (Signed)
lmtcb

## 2013-02-09 ENCOUNTER — Other Ambulatory Visit: Payer: Self-pay

## 2013-02-09 DIAGNOSIS — R002 Palpitations: Secondary | ICD-10-CM

## 2013-05-06 ENCOUNTER — Ambulatory Visit: Payer: Self-pay | Admitting: Otolaryngology

## 2013-09-23 ENCOUNTER — Ambulatory Visit (INDEPENDENT_AMBULATORY_CARE_PROVIDER_SITE_OTHER): Payer: BC Managed Care – PPO | Admitting: Family Medicine

## 2013-09-23 ENCOUNTER — Encounter: Payer: Self-pay | Admitting: Family Medicine

## 2013-09-23 VITALS — BP 124/86 | HR 109 | Temp 97.8°F | Wt 171.8 lb

## 2013-09-23 DIAGNOSIS — R7309 Other abnormal glucose: Secondary | ICD-10-CM

## 2013-09-23 DIAGNOSIS — A4159 Other Gram-negative sepsis: Secondary | ICD-10-CM

## 2013-09-23 DIAGNOSIS — N10 Acute tubulo-interstitial nephritis: Secondary | ICD-10-CM

## 2013-09-23 DIAGNOSIS — N200 Calculus of kidney: Secondary | ICD-10-CM | POA: Insufficient documentation

## 2013-09-23 DIAGNOSIS — A414 Sepsis due to anaerobes: Secondary | ICD-10-CM

## 2013-09-23 DIAGNOSIS — R739 Hyperglycemia, unspecified: Secondary | ICD-10-CM

## 2013-09-23 LAB — COMPREHENSIVE METABOLIC PANEL
ALT: 26 U/L (ref 0–35)
AST: 14 U/L (ref 0–37)
Albumin: 3.7 g/dL (ref 3.5–5.2)
Alkaline Phosphatase: 107 U/L (ref 39–117)
BUN: 16 mg/dL (ref 6–23)
CALCIUM: 9.5 mg/dL (ref 8.4–10.5)
CO2: 28 meq/L (ref 19–32)
Chloride: 101 mEq/L (ref 96–112)
Creatinine, Ser: 1 mg/dL (ref 0.4–1.2)
GFR: 70.03 mL/min (ref >60.00)
Glucose, Bld: 119 mg/dL — ABNORMAL HIGH (ref 70–99)
POTASSIUM: 4.2 meq/L (ref 3.5–5.1)
SODIUM: 137 meq/L (ref 135–145)
Total Bilirubin: 0.7 mg/dL (ref 0.3–1.2)
Total Protein: 7.8 g/dL (ref 6.0–8.3)

## 2013-09-23 LAB — CBC WITH DIFFERENTIAL/PLATELET
BASOS ABS: 0.1 10*3/uL (ref 0.0–0.1)
BASOS PCT: 1.2 % (ref 0.0–3.0)
EOS ABS: 0.2 10*3/uL (ref 0.0–0.7)
EOS PCT: 2.6 % (ref 0.0–5.0)
HEMATOCRIT: 39.5 % (ref 36.0–46.0)
Hemoglobin: 12.9 g/dL (ref 12.0–15.0)
LYMPHS PCT: 18.5 % (ref 12.0–46.0)
Lymphs Abs: 1.7 10*3/uL (ref 0.7–4.0)
MCHC: 32.6 g/dL (ref 30.0–36.0)
MCV: 92.9 fl (ref 78.0–100.0)
MONOS PCT: 8.4 % (ref 3.0–12.0)
Monocytes Absolute: 0.8 10*3/uL (ref 0.1–1.0)
Neutro Abs: 6.5 10*3/uL (ref 1.4–7.7)
Neutrophils Relative %: 69.3 % (ref 43.0–77.0)
RBC: 4.24 Mil/uL (ref 3.87–5.11)
RDW: 14 % (ref 11.5–14.6)
WBC: 9.4 10*3/uL (ref 4.5–10.5)

## 2013-09-23 LAB — HEMOGLOBIN A1C: HEMOGLOBIN A1C: 6.6 % — AB (ref 4.6–6.5)

## 2013-09-23 NOTE — Progress Notes (Signed)
Subjective:    Patient ID: Erica Singh, female    DOB: 01-22-1981, 33 y.o.   MRN: [id number]  HPI  33 year old female with POTS, hx of renal stones presents for hospitalization follow up. She was admitted from 1/17 to1/24/2015  at with Dx of renal stone, sepsis She began having  abdominal pain, left flank pain, vomiting on 1/13... Went to ER. Gave nausea med, fluids Dx with stones.  CT abd pelvis: left 8 mm stone. Scheduled for follow up a week later for stone removal with URO, Dr Ian MalkinVespercase [facility].  Started on cipro for klebsiella UTI. Unfortunately her symptoms got worse... Was very weak, presyncopal, emesis continued. Pain poorly controlled. UOP decreased. URO called her to get in for stent... Found on arrival to be septic.  Klenbsiella on blood cultures  Stent placed.. Pus from behind stone from kidney.  She was placed in ICU. Given IV antibiotics. WBC persisted elevated. Stent had falllen out.  Went back to ER bigger longer stent was placed.   She has been feeling better since, decreased pain. HAs not required pain meds since home.  No fever.  [facility].  Scheduled for lithotripsy and stent removal 09/29/2013  While she was in the hospital blood sugar was 180-200.  Since she has been home CBG has been fasting 156-186.  2 hours after meals 276  Hx of gestational  DM  Family hx.. Mother with type 2 DM, diet controlled.  Wt Readings from Last 3 Encounters:  09/23/13 171 lb 12 oz (77.905 kg)  12/04/12 190 lb 8 oz (86.41 kg)  11/03/12 197 lb 12 oz (89.699 kg)        Review of Systems  Constitutional: Negative for fever and fatigue.  HENT: Negative for ear pain.   Eyes: Negative for pain.  Respiratory: Negative for chest tightness and shortness of breath.   Cardiovascular: Negative for chest pain, palpitations and leg swelling.  Gastrointestinal: Negative for abdominal pain.  Genitourinary: Negative for dysuria.         Objective:   Physical Exam  Constitutional: Vital signs are normal. She appears well-developed and well-nourished. She is cooperative.  Non-toxic appearance. She does not appear ill. No distress.  HENT:  Head: Normocephalic.  Right Ear: Hearing, tympanic membrane, external ear and ear canal normal. Tympanic membrane is not erythematous, not retracted and not bulging.  Left Ear: Hearing, tympanic membrane, external ear and ear canal normal. Tympanic membrane is not erythematous, not retracted and not bulging.  Nose: No mucosal edema or rhinorrhea. Right sinus exhibits no maxillary sinus tenderness and no frontal sinus tenderness. Left sinus exhibits no maxillary sinus tenderness and no frontal sinus tenderness.  Mouth/Throat: Uvula is midline, oropharynx is clear and moist and mucous membranes are normal.  Eyes: Conjunctivae, EOM and lids are normal. Pupils are equal, round, and reactive to light. Lids are everted and swept, no foreign bodies found.  Neck: Trachea normal and normal range of motion. Neck supple. Carotid bruit is not present. No mass and no thyromegaly present.  Cardiovascular: Normal rate, regular rhythm, S1 normal, S2 normal, normal heart sounds, intact distal pulses and normal pulses.  Exam reveals no gallop and no friction rub.   No murmur heard. Pulmonary/Chest: Effort normal and breath sounds normal. Not tachypneic. No respiratory distress. She has no decreased breath sounds. She has no wheezes. She has no rhonchi. She has no rales.  Abdominal: Soft. Normal appearance and bowel sounds are normal.  There is no tenderness.  Neurological: She is alert.  Skin: Skin is warm, dry and intact. No rash noted.  Psychiatric: Her speech is normal and behavior is normal. Judgment and thought content normal. Her mood appears not anxious. Cognition and memory are normal. She does not exhibit a depressed mood.          Assessment & Plan:  Total visit time 30 minutes, > 50% spent  counseling and cordinating patients care. Records reviewed.  Uro following stone.. Repeat urine culture clear, on flomax... Plan lithotripsy on 2/4  Sepsis/ pyelo: Completing levaquin course.. Will check cbc to make sure wbc remaining low.  Hyperglycemia: sned for fasting glucose and A1C.

## 2013-09-23 NOTE — Progress Notes (Signed)
Pre-visit discussion using our clinic review tool. No additional management support is needed unless otherwise documented below in the visit note.  

## 2013-09-23 NOTE — Patient Instructions (Signed)
Stop at lab on way out... We will call with the results. Continue pushing fluids, working on low carb diet.

## 2013-09-24 ENCOUNTER — Encounter: Payer: Self-pay | Admitting: *Deleted

## 2014-04-18 ENCOUNTER — Other Ambulatory Visit (INDEPENDENT_AMBULATORY_CARE_PROVIDER_SITE_OTHER): Payer: BC Managed Care – PPO

## 2014-04-18 DIAGNOSIS — R7309 Other abnormal glucose: Secondary | ICD-10-CM

## 2014-04-18 LAB — HEMOGLOBIN A1C: Hgb A1c MFr Bld: 5.8 % (ref 4.6–6.5)

## 2014-08-31 ENCOUNTER — Encounter: Payer: Self-pay | Admitting: Internal Medicine

## 2014-08-31 ENCOUNTER — Ambulatory Visit (INDEPENDENT_AMBULATORY_CARE_PROVIDER_SITE_OTHER): Payer: BLUE CROSS/BLUE SHIELD | Admitting: Internal Medicine

## 2014-08-31 VITALS — BP 124/80 | HR 78 | Temp 98.8°F | Ht 67.0 in | Wt 207.0 lb

## 2014-08-31 DIAGNOSIS — H8113 Benign paroxysmal vertigo, bilateral: Secondary | ICD-10-CM

## 2014-08-31 DIAGNOSIS — G4489 Other headache syndrome: Secondary | ICD-10-CM

## 2014-08-31 DIAGNOSIS — H9390 Unspecified disorder of ear, unspecified ear: Secondary | ICD-10-CM

## 2014-08-31 MED ORDER — SUMATRIPTAN SUCCINATE 100 MG PO TABS: ORAL_TABLET | ORAL | Status: DC

## 2014-08-31 NOTE — Patient Instructions (Signed)

## 2014-08-31 NOTE — Progress Notes (Signed)
Pre visit review using our clinic review tool, if applicable. No additional management support is needed unless otherwise documented below in the visit note. 

## 2014-09-01 NOTE — Progress Notes (Signed)
Subjective:    Patient ID: Erica Singh, female    DOB: 1981-03-05, 34 y.o.   MRN: [id number]  HPI She is being seen acutely for two issues.   Most bothersome is significant dizziness which has been present intermittently for one month. It is random in its presentation. Triggers can include changing position in bed or body position change . She's also noted this with hyperextension of the head & neck. This is associated with lightheadedness and some vertigo.  The symptoms can last two - three hours.   She has had some numbness or tingling in the extremities as well as weakness. The latter occurs infrequently.  She also notes blurred vision occasionally. Additionally she's had headaches intermittently since age 9.Again presentation is random. These can last hours intermittently over several days. The headaches are only on the right side of the head She takes 800 mg of Ibuprofen twice a day as needed. This is partially beneficial. There is no prodrome. She does have some visual changes in the right eye with the headache occasionally. There is no FH of migraines.She has not been diagnosed with migraines..  The dizziness & headaches may occur separately or together.  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 She also has a diagnosis of Positional Orthostatic Syndrome for which she is seen @ [facility] in [place], [place].Her pulse can be 110-120 after standing for 10 minutes. This is treated with increased fluids. She also avoids caffeine.   Review of Systems   She denies tinnitus or hearing loss.  She's had no associated rhinosinusitis symptoms.  There is no double vision loss of vision.      Objective:   Physical Exam Gen.: Healthy and well-nourished in appearance. Alert, appropriate and cooperative throughout exam. Appears younger than stated age  Head: Normocephalic without obvious abnormalities  Eyes: No corneal or conjunctival inflammation noted.  Pupils equal round reactive to light and accommodation. Extraocular motion reveals decreased internal rotation bilaterally when looking @ tip of nose . Eyes prominent with minimal lid lag. Vision grossly normal w/o lenses Ears: External  ear exam reveals no significant lesions or deformities. Canals clear .TMs normal. Hearing is grossly normal bilaterally. Tuning fork lateralizes to R. Nose: External nasal exam reveals no deformity or inflammation. Nasal mucosa are pink and moist. No lesions or exudates noted.   Mouth: Oral mucosa and oropharynx reveal no lesions or exudates. Teeth in good repair. Neck: No deformities, masses, or tenderness noted. Range of motion & Thyroid normal. Lungs: Normal respiratory effort; chest expands symmetrically. Lungs are clear to auscultation without rales, wheezes, or increased work of breathing. Heart: Normal rate and rhythm. Normal S1 and S2. No gallop, click, or rub. No murmur.           Musculoskeletal/extremities: No deformity or scoliosis noted of  the thoracic or lumbar spine.  No clubbing, cyanosis, edema, or significant extremity  deformity noted.  Range of motion normal . Tone & strength normal. Hand joints normal Fingernail health good. Able to lie down & sit up w/o help.  Negative SLR bilaterally Vascular: Carotid, radial artery, dorsalis pedis and  posterior tibial pulses are full and equal. No bruits present. Neurologic: Alert and oriented x3. Deep tendon reflexes symmetrical and normal.  Gait normal  including heel & toe walking .  Rhomberg & finger to nose negative       Testing for benign positional vertigo included placing her supine as her head was rotated laterally passively while  she stared straight head. This revealed no nystagmus but nausea with both right & left lateral rotation  Skin: Intact without suspicious lesions or rashes. Lymph: No cervical, axillary lymphadenopathy present. Psych: Mood and affect are normal. Normally interactive                                                                                        Assessment & Plan:  #1 dizziness , ? BPV  #2 R sided headache ,R/O migraine #3 abnormal tuning fork exam Plan: trial of Imitrex ENT referral. She has seen Dr Lillette BoxerYengle @ [facility]

## 2014-09-06 ENCOUNTER — Ambulatory Visit: Payer: Self-pay | Admitting: Family Medicine

## 2014-09-06 ENCOUNTER — Telehealth: Payer: Self-pay | Admitting: Family Medicine

## 2014-09-06 NOTE — Telephone Encounter (Signed)
Patient did not come for their scheduled appointment today dizzy lightheaded.  Please let me know if the patient needs to be contacted immediately for follow up or if no follow up is necessary.

## 2014-09-06 NOTE — Telephone Encounter (Signed)
Left message asking pt to call office  °

## 2014-09-06 NOTE — Telephone Encounter (Signed)
Please call pt, have her make follow up only if her symptoms are continuing.

## 2014-09-07 NOTE — Telephone Encounter (Signed)
Left message asking pt to call office  °

## 2014-09-08 ENCOUNTER — Ambulatory Visit: Payer: Self-pay

## 2014-09-08 NOTE — Telephone Encounter (Signed)
Left message asking pt to call office  °

## 2014-09-13 ENCOUNTER — Encounter: Payer: Self-pay | Admitting: Family Medicine

## 2014-09-13 NOTE — Telephone Encounter (Signed)
Mailed letter °

## 2014-12-29 ENCOUNTER — Ambulatory Visit (INDEPENDENT_AMBULATORY_CARE_PROVIDER_SITE_OTHER): Payer: BC Managed Care – PPO | Admitting: Family Medicine

## 2014-12-29 ENCOUNTER — Encounter: Payer: Self-pay | Admitting: Family Medicine

## 2014-12-29 VITALS — BP 110/80 | HR 88 | Temp 98.4°F | Ht 67.0 in | Wt 209.8 lb

## 2014-12-29 DIAGNOSIS — R5382 Chronic fatigue, unspecified: Secondary | ICD-10-CM

## 2014-12-29 DIAGNOSIS — R000 Tachycardia, unspecified: Secondary | ICD-10-CM

## 2014-12-29 DIAGNOSIS — I951 Orthostatic hypotension: Secondary | ICD-10-CM

## 2014-12-29 DIAGNOSIS — R5383 Other fatigue: Secondary | ICD-10-CM | POA: Insufficient documentation

## 2014-12-29 DIAGNOSIS — G90A Postural orthostatic tachycardia syndrome (POTS): Secondary | ICD-10-CM

## 2014-12-29 LAB — CBC WITH DIFFERENTIAL/PLATELET
Basophils Absolute: 0.1 10*3/uL (ref 0.0–0.1)
Basophils Relative: 0.8 % (ref 0.0–3.0)
EOS ABS: 0.4 10*3/uL (ref 0.0–0.7)
EOS PCT: 4.3 % (ref 0.0–5.0)
HCT: 39.1 % (ref 36.0–46.0)
Hemoglobin: 13.5 g/dL (ref 12.0–15.0)
LYMPHS ABS: 1.8 10*3/uL (ref 0.7–4.0)
LYMPHS PCT: 19.1 % (ref 12.0–46.0)
MCHC: 34.5 g/dL (ref 30.0–36.0)
MCV: 89.5 fl (ref 78.0–100.0)
MONOS PCT: 7.3 % (ref 3.0–12.0)
Monocytes Absolute: 0.7 10*3/uL (ref 0.1–1.0)
NEUTROS PCT: 68.5 % (ref 43.0–77.0)
Neutro Abs: 6.3 10*3/uL (ref 1.4–7.7)
PLATELETS: 297 10*3/uL (ref 150.0–400.0)
RBC: 4.37 Mil/uL (ref 3.87–5.11)
RDW: 13.2 % (ref 11.5–15.5)
WBC: 9.2 10*3/uL (ref 4.0–10.5)

## 2014-12-29 LAB — COMPREHENSIVE METABOLIC PANEL
ALBUMIN: 4 g/dL (ref 3.5–5.2)
ALT: 29 U/L (ref 0–35)
AST: 21 U/L (ref 0–37)
Alkaline Phosphatase: 70 U/L (ref 39–117)
BUN: 13 mg/dL (ref 6–23)
CALCIUM: 9.4 mg/dL (ref 8.4–10.5)
CHLORIDE: 103 meq/L (ref 96–112)
CO2: 25 meq/L (ref 19–32)
Creatinine, Ser: 0.7 mg/dL (ref 0.40–1.20)
GFR: 101.28 mL/min (ref >60.00)
GLUCOSE: 120 mg/dL — AB (ref 70–99)
POTASSIUM: 4.2 meq/L (ref 3.5–5.1)
SODIUM: 135 meq/L (ref 135–145)
TOTAL PROTEIN: 7.3 g/dL (ref 6.0–8.3)
Total Bilirubin: 0.3 mg/dL (ref 0.2–1.2)

## 2014-12-29 LAB — TSH: TSH: 2.66 u[IU]/mL (ref 0.35–4.50)

## 2014-12-29 LAB — VITAMIN D 25 HYDROXY (VIT D DEFICIENCY, FRACTURES): VITD: 14.46 ng/mL — ABNORMAL LOW (ref 30.00–100.00)

## 2014-12-29 LAB — HEMOGLOBIN A1C: HEMOGLOBIN A1C: 6.1 % (ref 4.6–6.5)

## 2014-12-29 LAB — VITAMIN B12: Vitamin B-12: 209 pg/mL — ABNORMAL LOW (ref 211–911)

## 2014-12-29 NOTE — Patient Instructions (Signed)
Stop at lab on way out, we will call with results.  Call if new symptoms develop.

## 2014-12-29 NOTE — Progress Notes (Signed)
Pre visit review using our clinic review tool, if applicable. No additional management support is needed unless otherwise documented below in the visit note. 

## 2014-12-29 NOTE — Assessment & Plan Note (Signed)
Likely due in oasrt to POTS and medication SE, but will rule out other secondary causes.

## 2014-12-29 NOTE — Progress Notes (Signed)
   Subjective:    Patient ID: Erica MooreCristina Mmaria Garcia Singh, female    DOB: Apr 13, 1981, 34 y.o.   MRN: [id number]  HPI 34 year old female with POTS, high cholesterol and IBS presents with n.ew onset fatigue.Ongoing in last 3 months, progressively worse in last 4 weeks. She gets adequate sleep but wakes up fatigued. Fatigue off and on during the day, home from work she is exhausted. Very fatigued walking across street to work from parking lot. Mild SOB with exertion. Does not low back pain.  She has not noted heavy bleeding. No blood in urine or stool. She feels cold often but occ hot flashes. She has noted hair thinning. She has been trying to water aerobics. Eats balanace diet with protein and veggies.   She is currently followed by cardiology  [facility] [place] for POTS and is on metoprolol. LAst OV 08/2014 , released.  Literature reveals pt can experience fatigue with POTS as well as the medication to treat it ( BBlockers) can cause fatigue.  She has noted she has gained weight in last year.   Family history.. Mother and 3 sisters have hypothyroidism.     Review of Systems  Constitutional: Negative for fever and fatigue.  HENT: Negative for ear pain.   Eyes: Negative for pain.  Respiratory: Negative for chest tightness and shortness of breath.   Cardiovascular: Negative for chest pain, palpitations and leg swelling.  Gastrointestinal: Positive for constipation. Negative for abdominal pain.  Genitourinary: Negative for dysuria.  No nuasea, no abdominal pain.     Objective:   Physical Exam  Constitutional: Vital signs are normal. She appears well-developed and well-nourished. She is cooperative.  Non-toxic appearance. She does not appear ill. No distress.  HENT:  Head: Normocephalic.  Right Ear: Hearing, tympanic membrane, external ear and ear canal normal. Tympanic membrane is not erythematous, not retracted and not bulging.  Left Ear: Hearing, tympanic membrane, external  ear and ear canal normal. Tympanic membrane is not erythematous, not retracted and not bulging.  Nose: No mucosal edema or rhinorrhea. Right sinus exhibits no maxillary sinus tenderness and no frontal sinus tenderness. Left sinus exhibits no maxillary sinus tenderness and no frontal sinus tenderness.  Mouth/Throat: Uvula is midline, oropharynx is clear and moist and mucous membranes are normal.  Eyes: Conjunctivae, EOM and lids are normal. Pupils are equal, round, and reactive to light. Lids are everted and swept, no foreign bodies found.  Neck: Trachea normal and normal range of motion. Neck supple. Carotid bruit is not present. No thyroid mass and no thyromegaly present.  Cardiovascular: Normal rate, regular rhythm, S1 normal, S2 normal, normal heart sounds, intact distal pulses and normal pulses.  Exam reveals no gallop and no friction rub.   No murmur heard. Pulmonary/Chest: Effort normal and breath sounds normal. No tachypnea. No respiratory distress. She has no decreased breath sounds. She has no wheezes. She has no rhonchi. She has no rales.  Abdominal: Soft. Normal appearance and bowel sounds are normal. There is no tenderness.  Neurological: She is alert.  Skin: Skin is warm, dry and intact. No rash noted.  Psychiatric: Her speech is normal and behavior is normal. Judgment and thought content normal. Her mood appears not anxious. Cognition and memory are normal. She does not exhibit a depressed mood.          Assessment & Plan:

## 2014-12-30 ENCOUNTER — Telehealth: Payer: Self-pay | Admitting: *Deleted

## 2014-12-30 MED ORDER — VITAMIN D (ERGOCALCIFEROL) 1.25 MG (50000 UNIT) PO CAPS: 50000.0000 [IU] | ORAL_CAPSULE | ORAL | Status: DC

## 2014-12-30 NOTE — Telephone Encounter (Signed)
Silvestre MesiCristina notified as instructed by telephone.  Vit D prescription sent in to [facility] [address]

## 2014-12-30 NOTE — Telephone Encounter (Signed)
-----   Message from Excell SeltzerAmy E Bedsole, MD sent at 12/29/2014 11:31 PM EDT ----- Nonfasting.  She has no anemia, some mild prediabetes ( work on low carb diet, weight loss and exercise) nml liver and kidney, thyroid nml, vit D very low.. Send in rx for vit D 3 50,000 units weekly x 12 weeks.  also vit B12 low.. Have her start oral B12 1000 mcg daily

## 2015-03-07 ENCOUNTER — Telehealth: Payer: Self-pay | Admitting: Family Medicine

## 2015-03-07 ENCOUNTER — Other Ambulatory Visit: Payer: BC Managed Care – PPO

## 2015-03-07 DIAGNOSIS — E7800 Pure hypercholesterolemia, unspecified: Secondary | ICD-10-CM

## 2015-03-07 NOTE — Telephone Encounter (Signed)
-----   Message from Alvina Chouerri J Walsh sent at 03/01/2015  3:19 PM EDT ----- Regarding: Lab orders for Tuesday, 7.12.16 Patient is scheduled for CPX labs, please order future labs, Thanks , Camelia Engerri

## 2015-03-14 ENCOUNTER — Ambulatory Visit (INDEPENDENT_AMBULATORY_CARE_PROVIDER_SITE_OTHER): Payer: BC Managed Care – PPO | Admitting: Family Medicine

## 2015-03-14 ENCOUNTER — Telehealth: Payer: Self-pay | Admitting: Family Medicine

## 2015-03-14 DIAGNOSIS — E780 Pure hypercholesterolemia: Secondary | ICD-10-CM

## 2015-03-14 NOTE — Progress Notes (Signed)
No show

## 2015-03-14 NOTE — Telephone Encounter (Signed)
Patient did not come in for their appointment today for cpe.  Please let me know if patient needs to be contacted immediately for follow up or no follow up needed. °

## 2015-03-14 NOTE — Telephone Encounter (Signed)
Needs appt for CPX rescheduled, non urgently.

## 2015-03-16 NOTE — Telephone Encounter (Signed)
Called to r/s appt, n/s, l/m

## 2015-03-21 NOTE — Telephone Encounter (Signed)
L/m for pt to return call, need to r/s appt  °

## 2015-03-23 NOTE — Telephone Encounter (Signed)
Appointment 07/07/15 pt aware

## 2015-03-23 NOTE — Telephone Encounter (Signed)
Lm to return call.

## 2015-05-10 ENCOUNTER — Encounter: Payer: Self-pay | Admitting: Family Medicine

## 2015-05-10 ENCOUNTER — Ambulatory Visit (INDEPENDENT_AMBULATORY_CARE_PROVIDER_SITE_OTHER): Payer: BC Managed Care – PPO | Admitting: Family Medicine

## 2015-05-10 VITALS — BP 110/80 | HR 81 | Temp 98.3°F | Ht 67.0 in | Wt 205.5 lb

## 2015-05-10 DIAGNOSIS — G43001 Migraine without aura, not intractable, with status migrainosus: Secondary | ICD-10-CM | POA: Diagnosis not present

## 2015-05-10 DIAGNOSIS — G4489 Other headache syndrome: Secondary | ICD-10-CM | POA: Diagnosis not present

## 2015-05-10 MED ORDER — SUMATRIPTAN SUCCINATE 100 MG PO TABS: ORAL_TABLET | ORAL | Status: DC

## 2015-05-10 NOTE — Progress Notes (Signed)
Pre visit review using our clinic review tool, if applicable. No additional management support is needed unless otherwise documented below in the visit note. 

## 2015-05-10 NOTE — Progress Notes (Signed)
Dr. Karleen Hampshire T. Zakariyah Freimark, MD, CAQ [facility] [address] [place], [address] Phone: [phone] Fax: [phone]  05/10/2015  Patient: Erica Singh, MRN: [id number], DOB: May 04, 1981, 34 y.o.  Primary Physician:  Kerby Nora, MD  Chief Complaint: Headache  Subjective:   Erica Singh is a 34 y.o. very pleasant female patient who presents with the following:  In April, had a bad headache and dizziness and always only on the R side. Earlier this year, took some imitrex. 08/2014 she saw Dr. Alwyn Ren.  Used them then, Monday night woke up with a headache. Ibuprofen, naprosyn, vicodin.  Has problem with light and sound. She has had similar problems with hea difficulty with light and sound and earlier in her life, but she has not had problems in many years Previously, changes to her birth control pills alleviated the symptoms.    Past Medical History, Surgical History, Social History, Family History, Problem List, Medications, and Allergies have been reviewed and updated if relevant.  Patient Active Problem List   Diagnosis Date Noted  . Fatigue 12/29/2014  . Hyperglycemia 09/23/2013  . Sepsis due to Klebsiella pneumoniae 09/23/2013  . Acute pyelonephritis 09/23/2013  . Renal stone 09/23/2013  . POTS (postural orthostatic tachycardia syndrome) 12/04/2012  . Irregular heart rate 11/03/2012  . HYPERCHOLESTEROLEMIA 11/25/2008  . OBESITY, UNSPECIFIED 11/24/2008  . IRRITABLE BOWEL SYNDROME 11/24/2008  . RENAL CALCULUS, HX OF 11/24/2008  . DIABETES MELLITUS, GESTATIONAL, HX OF 11/24/2008    No past medical history on file.  Past Surgical History  Procedure Laterality Date  . Cesarean section  2006    Emergency  . Tubal ligation      Social History   Social History  . Marital Status: Married    Spouse Name: N/A  . Number of Children: N/A  . Years of Education: N/A   Occupational History  . Not on  file.   Social History Main Topics  . Smoking status: Never Smoker   . Smokeless tobacco: Never Used  . Alcohol Use: No  . Drug Use: No  . Sexual Activity: Not on file   Other Topics Concern  . Not on file   Social History Narrative    Family History  Problem Relation Age of Onset  . Hyperlipidemia Mother   . Heart disease Mother     cardiac Cath showed 40 % blockage  . Hyperlipidemia Father     No Known Allergies  Medication list reviewed and updated in full in [facility].  ROS: GEN: Acute illness details above GI: Tolerating PO intake GU: maintaining adequate hydration and urination Pulm: No SOB Interactive and getting along well at home.  Otherwise, ROS is as per the HPI.   Objective:   BP 110/80 mmHg  Pulse 81  Temp(Src) 98.3 F (36.8 C) (Oral)  Ht  (1.702 m)  Wt 205 lb 8 oz (93.214 kg)  BMI 32.18 kg/m2   GEN: WDWN, NAD, Non-toxic, A & O x 3 HEENT: Atraumatic, Normocephalic. Neck supple. No masses, No LAD. Ears and Nose: No external deformity. CV: RRR, No M/G/R. No JVD. No thrill. No extra heart sounds. PULM: CTA B, no wheezes, crackles, rhonchi. No retractions. No resp. distress. No accessory muscle use. ABD: S, NT, ND, +BS. No rebound tenderness. No HSM.  EXTR: No c/c/e  Neuro: CN 2-12 grossly intact. PERRLA. EOMI. Sensation intact throughout. Str 5/5 all extremities. DTR 2+. No clonus. A and  o x 4. Romberg neg. Finger nose neg. Heel -shin neg.   PSYCH: Normally interactive. Conversant. Not depressed or anxious appearing.  Calm demeanor.    Laboratory and Imaging Data:  Assessment and Plan:   Migraine without aura and with status migrainosus, not intractable  Other headache syndrome - Plan: SUMAtriptan (IMITREX) 100 MG tablet  C/w migraine Refill imitrex If sx become more frequent, then proph could be considered.   Signed,  Elpidio Galea. Zephyra Bernardi, MD   Patient's Medications  New Prescriptions   No medications on file    Previous Medications   METOPROLOL (LOPRESSOR) 50 MG TABLET    Take 50 mg by mouth daily.   NORETHIN ACE-ETH ESTRAD-FE (LOESTRIN FE 1/20 PO)    Take 1 tablet by mouth daily.  Modified Medications   Modified Medication Previous Medication   SUMATRIPTAN (IMITREX) 100 MG TABLET SUMAtriptan (IMITREX) 100 MG tablet      1 as needed for headache.May repeat in 2 hours if headache persists or recurs.    1 as needed for headache.May repeat in 2 hours if headache persists or recurs.  Discontinued Medications   VITAMIN D, ERGOCALCIFEROL, (DRISDOL) 50000 UNITS CAPS CAPSULE    Take 1 capsule (50,000 Units total) by mouth every 7 (seven) days.

## 2015-05-11 ENCOUNTER — Encounter: Payer: Self-pay | Admitting: Family Medicine

## 2015-05-11 DIAGNOSIS — G43009 Migraine without aura, not intractable, without status migrainosus: Secondary | ICD-10-CM

## 2015-05-11 HISTORY — DX: Migraine without aura, not intractable, without status migrainosus: G43.009

## 2015-06-19 LAB — HM PAP SMEAR: HM Pap smear: NEGATIVE

## 2015-07-07 ENCOUNTER — Encounter: Payer: Self-pay | Admitting: Family Medicine

## 2015-07-07 ENCOUNTER — Ambulatory Visit (INDEPENDENT_AMBULATORY_CARE_PROVIDER_SITE_OTHER): Payer: BC Managed Care – PPO | Admitting: Family Medicine

## 2015-07-07 VITALS — BP 120/78 | HR 74 | Temp 98.4°F | Ht 66.75 in | Wt 205.5 lb

## 2015-07-07 DIAGNOSIS — E559 Vitamin D deficiency, unspecified: Secondary | ICD-10-CM | POA: Diagnosis not present

## 2015-07-07 DIAGNOSIS — E7800 Pure hypercholesterolemia, unspecified: Secondary | ICD-10-CM

## 2015-07-07 DIAGNOSIS — Z0000 Encounter for general adult medical examination without abnormal findings: Secondary | ICD-10-CM | POA: Diagnosis not present

## 2015-07-07 DIAGNOSIS — Z23 Encounter for immunization: Secondary | ICD-10-CM

## 2015-07-07 DIAGNOSIS — I951 Orthostatic hypotension: Secondary | ICD-10-CM

## 2015-07-07 DIAGNOSIS — E282 Polycystic ovarian syndrome: Secondary | ICD-10-CM

## 2015-07-07 DIAGNOSIS — R739 Hyperglycemia, unspecified: Secondary | ICD-10-CM | POA: Diagnosis not present

## 2015-07-07 DIAGNOSIS — R5382 Chronic fatigue, unspecified: Secondary | ICD-10-CM

## 2015-07-07 DIAGNOSIS — R000 Tachycardia, unspecified: Secondary | ICD-10-CM

## 2015-07-07 DIAGNOSIS — G90A Postural orthostatic tachycardia syndrome (POTS): Secondary | ICD-10-CM

## 2015-07-07 LAB — COMPREHENSIVE METABOLIC PANEL
ALBUMIN: 4.1 g/dL (ref 3.5–5.2)
ALK PHOS: 76 U/L (ref 39–117)
ALT: 16 U/L (ref 0–35)
AST: 15 U/L (ref 0–37)
BILIRUBIN TOTAL: 0.3 mg/dL (ref 0.2–1.2)
BUN: 12 mg/dL (ref 6–23)
CALCIUM: 9.7 mg/dL (ref 8.4–10.5)
CO2: 29 mEq/L (ref 19–32)
CREATININE: 0.74 mg/dL (ref 0.40–1.20)
Chloride: 101 mEq/L (ref 96–112)
GFR: 94.71 mL/min (ref >60.00)
Glucose, Bld: 89 mg/dL (ref 70–99)
Potassium: 4 mEq/L (ref 3.5–5.1)
Sodium: 138 mEq/L (ref 135–145)
Total Protein: 7.5 g/dL (ref 6.0–8.3)

## 2015-07-07 LAB — HEMOGLOBIN A1C: HEMOGLOBIN A1C: 6.1 % (ref 4.6–6.5)

## 2015-07-07 LAB — VITAMIN D 25 HYDROXY (VIT D DEFICIENCY, FRACTURES): VITD: 19.06 ng/mL — ABNORMAL LOW (ref 30.00–100.00)

## 2015-07-07 NOTE — Assessment & Plan Note (Signed)
Multifactorial. Eval neg. MAy be in part due to metoprolol.

## 2015-07-07 NOTE — Progress Notes (Signed)
Pre visit review using our clinic review tool, if applicable. No additional management support is needed unless otherwise documented below in the visit note. 

## 2015-07-07 NOTE — Assessment & Plan Note (Signed)
GYN recs considering metformin.Marland Kitchen. DIscussed option of victoza. Will eval A1C firsst.

## 2015-07-07 NOTE — Assessment & Plan Note (Signed)
Due for re-eval. 

## 2015-07-07 NOTE — Addendum Note (Signed)
Addended by: Baldomero LamyHAVERS, Gregroy Dombkowski C on: 07/07/2015 02:36 PM   Modules accepted: Orders, SmartSet

## 2015-07-07 NOTE — Assessment & Plan Note (Signed)
Due fpor re-eval. 

## 2015-07-07 NOTE — Patient Instructions (Addendum)
Can try 1/2 tabs of metoprolol extra when HR increasing.  Stop at lab on way out.  Work on [employer] and [employer]healthy eating and regular exercise.

## 2015-07-07 NOTE — Progress Notes (Signed)
Subjective:    Patient ID: Erica Singh, female    DOB: 15-Jul-1981, 34 y.o.   MRN: [id number]  HPI  34 year old female presents for  wellness exam.  She has been doing well overall.  Working out has helped some with fatigue. Nml TSH in 12/2014. ON B12 daily., did not help much  Has noted some swelling in ankles after on feet during the day, better later in day.  Elevated Cholesterol:  Due for re-eval. Lab Results  Component Value Date   CHOL 185 11/24/2008   HDL 36.00* 11/24/2008   LDLCALC 119* 11/24/2008   TRIG 151.0* 11/24/2008   CHOLHDL 5 11/24/2008  Using medications without problems: Muscle aches:  None Diet compliance: healthy. Exercise: 4-5 days a week. Other complaints:  POTS: on metoprolol for heart rate control. Followed by  Cardiologist.  BP Readings from Last 3 Encounters:  07/07/15 120/78  05/10/15 110/80  12/29/14 110/80  She has noted swelling in feet at end of day.  Hyperglycemia:  Will re-eval. Interested in weight loss. Per GYN may have PCOS. Hx of gestational DM.   Body mass index is 32.44 kg/(m^2).   Review of Systems  Constitutional: Positive for fatigue. Negative for fever.  HENT: Negative for ear pain.   Eyes: Negative for pain.  Respiratory: Negative for chest tightness and shortness of breath.   Cardiovascular: Negative for chest pain, palpitations and leg swelling.  Gastrointestinal: Negative for abdominal pain.  Genitourinary: Negative for dysuria.       Objective:   Physical Exam  Constitutional: Vital signs are normal. She appears well-developed and well-nourished. She is cooperative.  Non-toxic appearance. She does not appear ill. No distress.  HENT:  Head: Normocephalic.  Right Ear: Hearing, tympanic membrane, external ear and ear canal normal. Tympanic membrane is not erythematous, not retracted and not bulging.  Left Ear: Hearing, tympanic membrane, external ear and ear canal normal. Tympanic membrane is not  erythematous, not retracted and not bulging.  Nose: Nose normal. No mucosal edema or rhinorrhea. Right sinus exhibits no maxillary sinus tenderness and no frontal sinus tenderness. Left sinus exhibits no maxillary sinus tenderness and no frontal sinus tenderness.  Mouth/Throat: Uvula is midline, oropharynx is clear and moist and mucous membranes are normal.  Eyes: Conjunctivae, EOM and lids are normal. Pupils are equal, round, and reactive to light. Lids are everted and swept, no foreign bodies found.  Neck: Trachea normal and normal range of motion. Neck supple. Carotid bruit is not present. No thyroid mass and no thyromegaly present.  Cardiovascular: Normal rate, regular rhythm, S1 normal, S2 normal, normal heart sounds, intact distal pulses and normal pulses.  Exam reveals no gallop and no friction rub.   No murmur heard. Pulmonary/Chest: Effort normal and breath sounds normal. No tachypnea. No respiratory distress. She has no decreased breath sounds. She has no wheezes. She has no rhonchi. She has no rales.  Abdominal: Soft. Normal appearance and bowel sounds are normal. She exhibits no distension, no fluid wave, no abdominal bruit and no mass. There is no hepatosplenomegaly. There is no tenderness. There is no rebound, no guarding and no CVA tenderness. No hernia.  Lymphadenopathy:    She has no cervical adenopathy.    She has no axillary adenopathy.  Neurological: She is alert. She has normal strength. No cranial nerve deficit or sensory deficit.  Skin: Skin is warm, dry and intact. No rash noted.  Psychiatric: Her speech is normal and behavior is normal. Judgment  and thought content normal. Her mood appears not anxious. Cognition and memory are normal. She does not exhibit a depressed mood.          Assessment & Plan:  The patient's preventative maintenance and recommended screening tests for an annual wellness exam were reviewed in full today. Brought up to date unless services  declined.  Counselled on the importance of diet, exercise, and its role in overall health and mortality. The patient's FH and SH was reviewed, including their home life, tobacco status, and drug and alcohol status.   PAP/DVE: per GYN. not due for mammograms: sister with Breast cancer age 450.. Start mammo at 39. Vaccines: Due for Tdap and flu Nonsmoker  STD screen: refused.

## 2015-07-07 NOTE — Addendum Note (Signed)
Addended by: Damita LackLORING, Yashar Inclan S on: 07/07/2015 12:24 PM   Modules accepted: Orders

## 2015-07-07 NOTE — Assessment & Plan Note (Signed)
ON lopressor.. HR increased later in day.. Can take extra 1/2 tab of metoprolol as rec by cards.

## 2015-07-11 ENCOUNTER — Telehealth: Payer: Self-pay | Admitting: Family Medicine

## 2015-07-11 DIAGNOSIS — R7989 Other specified abnormal findings of blood chemistry: Secondary | ICD-10-CM

## 2015-07-11 DIAGNOSIS — R7303 Prediabetes: Secondary | ICD-10-CM

## 2015-07-11 MED ORDER — VITAMIN D (ERGOCALCIFEROL) 1.25 MG (50000 UNIT) PO CAPS: 50000.0000 [IU] | ORAL_CAPSULE | ORAL | Status: DC

## 2015-07-11 MED ORDER — METFORMIN HCL ER (MOD) 500 MG PO TB24: 500.0000 mg | ORAL_TABLET | Freq: Every day | ORAL | Status: DC

## 2015-07-11 NOTE — Telephone Encounter (Signed)
Erica Singh notifiied as instructed by telephone.  She will call back and schedule 3 month follow up with labs prior once she looks at her schedule.

## 2015-07-11 NOTE — Telephone Encounter (Signed)
-----   Message from Damita Lackonna S Loring, CMA sent at 07/11/2015 10:48 AM EST ----- Silvestre Mesiristina notified as instructed by telephone.  She would like to go on the Metformin.   [facility] [place].

## 2015-07-11 NOTE — Telephone Encounter (Signed)
Sent in metformin and vit D ( she should be on long term).  Make follow up appt in 3 months with labs prior.

## 2015-11-15 ENCOUNTER — Encounter: Payer: Self-pay | Admitting: Family Medicine

## 2015-11-15 MED ORDER — METOPROLOL TARTRATE 50 MG PO TABS: 50.0000 mg | ORAL_TABLET | Freq: Every day | ORAL | Status: DC

## 2015-11-22 ENCOUNTER — Encounter: Payer: Self-pay | Admitting: Family Medicine

## 2015-11-23 NOTE — Telephone Encounter (Signed)
Erica Singh, this pt received the wrong medication.. The short acting  (lopressor)(tartrate), not the long acting (succinate). Make sure the right one is entered on her med list... This was our first time ordering it for her and I think it was on her med list wrong; that is why the mistake happened.

## 2015-11-24 MED ORDER — METOPROLOL SUCCINATE ER 50 MG PO TB24: 50.0000 mg | ORAL_TABLET | Freq: Every day | ORAL | Status: DC

## 2015-11-24 NOTE — Addendum Note (Signed)
Addended by: Damita LackLORING, DONNA S on: 11/24/2015 08:20 AM   Modules accepted: Orders, Medications

## 2016-03-21 ENCOUNTER — Encounter: Payer: Self-pay | Admitting: Family Medicine

## 2016-03-21 ENCOUNTER — Ambulatory Visit (INDEPENDENT_AMBULATORY_CARE_PROVIDER_SITE_OTHER): Payer: BC Managed Care – PPO | Admitting: Family Medicine

## 2016-03-21 DIAGNOSIS — G43001 Migraine without aura, not intractable, with status migrainosus: Secondary | ICD-10-CM

## 2016-03-21 DIAGNOSIS — R000 Tachycardia, unspecified: Secondary | ICD-10-CM | POA: Diagnosis not present

## 2016-03-21 DIAGNOSIS — I951 Orthostatic hypotension: Secondary | ICD-10-CM

## 2016-03-21 DIAGNOSIS — G90A Postural orthostatic tachycardia syndrome (POTS): Secondary | ICD-10-CM

## 2016-03-21 MED ORDER — TOPIRAMATE ER 25 MG PO CAP24: ORAL_CAPSULE | ORAL | 5 refills | Status: DC

## 2016-03-21 MED ORDER — ELETRIPTAN HYDROBROMIDE 40 MG PO TABS: 40.0000 mg | ORAL_TABLET | ORAL | 0 refills | Status: DC | PRN

## 2016-03-21 NOTE — Patient Instructions (Signed)
Start topamax at bedtime daily. Can try relpax for acute migraine instead of imitrex, or can try 1-2 aleve along with imitrex as long as no stomach upset. MAke sure to take with food.  drink water, get adequate sleep and regualr exercise.

## 2016-03-21 NOTE — Assessment & Plan Note (Signed)
Stable control.Molli Knock to take additional toprol XL if HR > 100.

## 2016-03-21 NOTE — Progress Notes (Signed)
Pre visit review using our clinic review tool, if applicable. No additional management support is needed unless otherwise documented below in the visit note. 

## 2016-03-21 NOTE — Assessment & Plan Note (Addendum)
Poor control.  Discussed adjunct of aleve with imitrex versus or trial of relpax. Discussed increasing BBlocker, changing to propranolol or topamax.  Decided on starting topamax low dose 25 mg daily, follow up in 4-6 weeks for re-eval and increase in dose.

## 2016-03-21 NOTE — Progress Notes (Signed)
SUBJECTIVE:  Erica Singh is a 35 y.o. female who complains of migraine headache for many years. She has a well established history of recurrent migraines.  She notes increase in frequency of migraine ( 2-3 times a month) in the last 4-5 months.  Off and on for 3 days. Takes imitrex..Goes to sleep. Headache still comes back later in the day despite the imitrex. Seems to be associated with menses.  She joined a study at [facility] about relationship of migraines and diet. No clear diet triggers. Sleeping well at night, no mood issues, no excessive stress.  No med change associated.  Description of pain: throbbing pain, unilateral in the right frontal area. Associated symptoms: occ dizzy, occ nausea, no neuro changes.  In past trid OCPs (lo-estrin).. Did not help with migraines.  On metoprolol for POTTS and palpitations.  Current Outpatient Prescriptions  Medication Sig Dispense Refill  . metoprolol succinate (TOPROL-XL) 50 MG 24 hr tablet Take 1 tablet (50 mg total) by mouth daily. Take with or immediately following a meal. 90 tablet 1  . SUMAtriptan (IMITREX) 100 MG tablet 1 as needed for headache.May repeat in 2 hours if headache persists or recurs. 9 tablet 11  . Vitamin D, Ergocalciferol, (DRISDOL) 50000 UNITS CAPS capsule Take 1 capsule (50,000 Units total) by mouth every 7 (seven) days. 12 capsule 3   No current facility-administered medications for this visit.     There are no associated abnormal neurological symptoms such as TIA's, loss of balance, loss of vision or speech, numbness or weakness on review. Past neurological history: negative for stroke, MS, epilepsy, or brain tumor.   OBJECTIVE:  Patient appears in pain, preferring to lie in a darkened room. Her vitals are normal. Alert and oriented x 3.  Ears and throat normal. Neck fully supple without nodes. Sinuses non tender. Cranial nerves are normal.  Fundi are normal with sharp disc margins, no papilledema,  hemorrhages or exudates noted. DTR's normal and symmetric. Babinski sign absent.  Mental status normal. Cerebellar function normal.   ASSESSMENT:  Migraine headache

## 2016-03-23 LAB — HM DIABETES EYE EXAM

## 2016-05-03 ENCOUNTER — Ambulatory Visit (INDEPENDENT_AMBULATORY_CARE_PROVIDER_SITE_OTHER): Payer: BC Managed Care – PPO | Admitting: Family Medicine

## 2016-05-03 ENCOUNTER — Encounter: Payer: Self-pay | Admitting: Family Medicine

## 2016-05-03 VITALS — BP 138/73 | HR 86 | Temp 98.7°F | Ht 66.75 in | Wt 218.2 lb

## 2016-05-03 DIAGNOSIS — G43001 Migraine without aura, not intractable, with status migrainosus: Secondary | ICD-10-CM | POA: Diagnosis not present

## 2016-05-03 MED ORDER — PROPRANOLOL HCL ER 80 MG PO CP24: 80.0000 mg | ORAL_CAPSULE | Freq: Every day | ORAL | 11 refills | Status: DC

## 2016-05-03 NOTE — Progress Notes (Signed)
   Subjective:    Patient ID: Erica Singh, female    DOB: November 23, 1980, 35 y.o.   MRN: [id number]  HPI  35 year old female presents for follow up migraines. When seen on 03/21/2016:  She notes increase in frequency of migraine ( 2-3 times a month) in the last 4-5 months.  Off and on for 3 days. Takes imitrex..Goes to sleep. Headache still comes back later in the day despite the imitrex.  She was then started on topamax daily.  Given relpax for acute treatment.  Today she reports she did not tolerate topamax after taking for 4 days.  Took 5 days for her  get back to normal..  SE: fatigue, unable to function given sedation. She has had 2 migraines in last month. One was associated with menses. 2nd occure right after menses.  2nd on return the second day after taking imitrex.  She has not tried relpax yet.  She has not noted any specific triggers.  She tried Loestrin in past 3 monthat a time.. Did not help.   Review of Systems  Constitutional: Negative for fatigue and fever.  HENT: Negative for ear pain.   Eyes: Negative for pain.  Respiratory: Negative for chest tightness and shortness of breath.   Cardiovascular: Negative for chest pain, palpitations and leg swelling.  Gastrointestinal: Negative for abdominal pain.  Genitourinary: Negative for dysuria.       Objective:   Physical Exam  Constitutional: Vital signs are normal. She appears well-developed and well-nourished. She is cooperative.  Non-toxic appearance. She does not appear ill. No distress.  HENT:  Head: Normocephalic.  Right Ear: Hearing, tympanic membrane, external ear and ear canal normal. Tympanic membrane is not erythematous, not retracted and not bulging.  Left Ear: Hearing, tympanic membrane, external ear and ear canal normal. Tympanic membrane is not erythematous, not retracted and not bulging.  Nose: No mucosal edema or rhinorrhea. Right sinus exhibits no maxillary sinus tenderness and no  frontal sinus tenderness. Left sinus exhibits no maxillary sinus tenderness and no frontal sinus tenderness.  Mouth/Throat: Uvula is midline, oropharynx is clear and moist and mucous membranes are normal.  Eyes: Conjunctivae, EOM and lids are normal. Pupils are equal, round, and reactive to light. Lids are everted and swept, no foreign bodies found.  Neck: Trachea normal and normal range of motion. Neck supple. Carotid bruit is not present. No thyroid mass and no thyromegaly present.  Cardiovascular: Normal rate, regular rhythm, S1 normal, S2 normal, normal heart sounds, intact distal pulses and normal pulses.  Exam reveals no gallop and no friction rub.   No murmur heard. Pulmonary/Chest: Effort normal and breath sounds normal. No tachypnea. No respiratory distress. She has no decreased breath sounds. She has no wheezes. She has no rhonchi. She has no rales.  Abdominal: Soft. Normal appearance and bowel sounds are normal. There is no tenderness.  Neurological: She is alert. She has normal strength. No cranial nerve deficit or sensory deficit.  Skin: Skin is warm, dry and intact. No rash noted.  Psychiatric: Her speech is normal and behavior is normal. Judgment and thought content normal. Her mood appears not anxious. Cognition and memory are normal. She does not exhibit a depressed mood.          Assessment & Plan:

## 2016-05-03 NOTE — Patient Instructions (Addendum)
We will try a trial of inderal instead of metoprolol.  Start inderal 80 mg daily. Follow BP and pulse at home. Next migraine try trial of relpax to see if stop recurrent headache.

## 2016-05-03 NOTE — Assessment & Plan Note (Signed)
Intolerant of Topamax. Will try changing BBlocker to inderal to see if decrease migraine further. May need to increase further.  if not helpful pt will return for follow up. Consider taking amitryptiline daily.

## 2016-06-07 ENCOUNTER — Other Ambulatory Visit: Payer: Self-pay | Admitting: Family Medicine

## 2016-06-07 NOTE — Telephone Encounter (Signed)
Last office visit 05/03/2016.  Last refilled 07/11/2015 for #12 with 3 refills.  Refill?

## 2016-08-08 ENCOUNTER — Other Ambulatory Visit: Payer: Self-pay | Admitting: *Deleted

## 2016-08-08 MED ORDER — PROPRANOLOL HCL ER 80 MG PO CP24: 80.0000 mg | ORAL_CAPSULE | Freq: Every day | ORAL | 2 refills | Status: DC

## 2016-08-08 NOTE — Telephone Encounter (Signed)
[facility] requesting 90 day supply.

## 2017-02-04 ENCOUNTER — Encounter: Payer: Self-pay | Admitting: Family Medicine

## 2017-02-04 ENCOUNTER — Ambulatory Visit (INDEPENDENT_AMBULATORY_CARE_PROVIDER_SITE_OTHER): Payer: BC Managed Care – PPO | Admitting: Family Medicine

## 2017-02-04 VITALS — BP 100/80 | HR 76 | Temp 98.4°F | Ht 67.0 in | Wt 216.8 lb

## 2017-02-04 DIAGNOSIS — Z0001 Encounter for general adult medical examination with abnormal findings: Secondary | ICD-10-CM

## 2017-02-04 DIAGNOSIS — Z0000 Encounter for general adult medical examination without abnormal findings: Secondary | ICD-10-CM

## 2017-02-04 DIAGNOSIS — G43001 Migraine without aura, not intractable, with status migrainosus: Secondary | ICD-10-CM | POA: Diagnosis not present

## 2017-02-04 DIAGNOSIS — E559 Vitamin D deficiency, unspecified: Secondary | ICD-10-CM | POA: Insufficient documentation

## 2017-02-04 DIAGNOSIS — E7800 Pure hypercholesterolemia, unspecified: Secondary | ICD-10-CM | POA: Diagnosis not present

## 2017-02-04 DIAGNOSIS — R739 Hyperglycemia, unspecified: Secondary | ICD-10-CM

## 2017-02-04 DIAGNOSIS — R7989 Other specified abnormal findings of blood chemistry: Secondary | ICD-10-CM | POA: Diagnosis not present

## 2017-02-04 DIAGNOSIS — R61 Generalized hyperhidrosis: Secondary | ICD-10-CM | POA: Insufficient documentation

## 2017-02-04 NOTE — Assessment & Plan Note (Signed)
Due for re-eval. 

## 2017-02-04 NOTE — Patient Instructions (Addendum)
Get back on track with regular exercise.  Please stop at the lab to set up to have labs drawn.

## 2017-02-04 NOTE — Progress Notes (Signed)
Subjective:    Patient ID: Erica Singh, female    DOB: [birth year], 36 y.o.   MRN: [id number]  HPI   The patient is here for annual wellness exam and preventative care.     She has been doing well overall.  Migraine:  Improved control on inderal, using relpax for acute migraine ( helped better than imitrex). Migraines occurring 1 in last month. Lasted 1 night, resolved with ibuprofen alone.  No dizziness and no lightheadedness.  Diet: moderate Exercise: minimal. Body mass index is 33.95 kg/m. Wt Readings from Last 3 Encounters:  02/04/17 216 lb 12 oz (98.3 kg)  05/03/16 218 lb 4 oz (99 kg)  03/21/16 216 lb 8 oz (98.2 kg)   Due for re-eval of high cholesterol.   She has been sweating a lot at night.  Social History /Family History/Past Medical History reviewed in detail and updated in EMR if needed. Blood pressure 100/80, pulse 76, temperature 98.4 F (36.9 C), temperature source Oral, height 5\' 7"  (1.702 m), weight 216 lb 12 oz (98.3 kg), last menstrual period 01/30/2017, unknown if currently breastfeeding.   Review of Systems  Constitutional: Negative for fatigue and fever.  HENT: Negative for congestion.   Eyes: Negative for pain.  Respiratory: Negative for cough and shortness of breath.   Cardiovascular: Negative for chest pain, palpitations and leg swelling.  Gastrointestinal: Negative for abdominal pain.  Genitourinary: Negative for dysuria and vaginal bleeding.  Musculoskeletal: Negative for back pain.  Neurological: Negative for syncope, light-headedness and headaches.  Psychiatric/Behavioral: Negative for dysphoric mood.       Objective:   Physical Exam  Constitutional: Vital signs are normal. She appears well-developed and well-nourished. She is cooperative.  Non-toxic appearance. She does not appear ill. No distress.  HENT:  Head: Normocephalic.  Right Ear: Hearing, tympanic membrane, external ear and ear canal normal.  Left Ear:  Hearing, tympanic membrane, external ear and ear canal normal.  Nose: Nose normal.  Eyes: Conjunctivae, EOM and lids are normal. Pupils are equal, round, and reactive to light. Lids are everted and swept, no foreign bodies found.  Neck: Trachea normal and normal range of motion. Neck supple. Carotid bruit is not present. No thyroid mass and no thyromegaly present.  Cardiovascular: Normal rate, regular rhythm, S1 normal, S2 normal, normal heart sounds and intact distal pulses.  Exam reveals no gallop.   No murmur heard. Pulmonary/Chest: Effort normal and breath sounds normal. No respiratory distress. She has no wheezes. She has no rhonchi. She has no rales.  Abdominal: Soft. Normal appearance and bowel sounds are normal. She exhibits no distension, no fluid wave, no abdominal bruit and no mass. There is no hepatosplenomegaly. There is no tenderness. There is no rebound, no guarding and no CVA tenderness. No hernia.  Lymphadenopathy:    She has no cervical adenopathy.    She has no axillary adenopathy.  Neurological: She is alert. She has normal strength. No cranial nerve deficit or sensory deficit.  Skin: Skin is warm, dry and intact. No rash noted.  Psychiatric: Her speech is normal and behavior is normal. Judgment normal. Her mood appears not anxious. Cognition and memory are normal. She does not exhibit a depressed mood.          Assessment & Plan:  The patient's preventative maintenance and recommended screening tests for an annual wellness exam were reviewed in full today. Brought up to date unless services declined.  Counselled on the importance of diet, exercise, and its  role in overall health and mortality. The patient's FH and SH was reviewed, including their home life, tobacco status, and drug and alcohol status.   PAP/DVE: per GYN. not due for mammograms: sister with Breast cancer age 37.. Start mammo at 39. Vaccines: uptodate Nonsmoker  STD screen: refused.   No early family  history of colon cancer.

## 2017-02-04 NOTE — Assessment & Plan Note (Signed)
Eval for secondary cause with labs including TSH and cbc.  No red flags. Possible due to POTS.

## 2017-02-04 NOTE — Assessment & Plan Note (Signed)
Due for repeat A1C. Encouraged exercise, weight loss, healthy eating habits.

## 2017-02-04 NOTE — Assessment & Plan Note (Signed)
Excellent control on inderal.

## 2017-02-05 LAB — CBC WITH DIFFERENTIAL/PLATELET
BASOS ABS: 0.1 10*3/uL (ref 0.0–0.1)
Basophils Relative: 0.9 % (ref 0.0–3.0)
EOS PCT: 4.9 % (ref 0.0–5.0)
Eosinophils Absolute: 0.4 10*3/uL (ref 0.0–0.7)
HCT: 39.9 % (ref 36.0–46.0)
HEMOGLOBIN: 13.5 g/dL (ref 12.0–15.0)
LYMPHS ABS: 2.1 10*3/uL (ref 0.7–4.0)
Lymphocytes Relative: 24.5 % (ref 12.0–46.0)
MCHC: 33.8 g/dL (ref 30.0–36.0)
MCV: 91 fl (ref 78.0–100.0)
MONO ABS: 0.6 10*3/uL (ref 0.1–1.0)
MONOS PCT: 7.2 % (ref 3.0–12.0)
NEUTROS PCT: 62.5 % (ref 43.0–77.0)
Neutro Abs: 5.4 10*3/uL (ref 1.4–7.7)
Platelets: 360 10*3/uL (ref 150.0–400.0)
RBC: 4.39 Mil/uL (ref 3.87–5.11)
RDW: 13 % (ref 11.5–15.5)
WBC: 8.6 10*3/uL (ref 4.0–10.5)

## 2017-02-05 LAB — COMPREHENSIVE METABOLIC PANEL
ALBUMIN: 4.3 g/dL (ref 3.5–5.2)
ALT: 47 U/L — AB (ref 0–35)
AST: 47 U/L — AB (ref 0–37)
Alkaline Phosphatase: 86 U/L (ref 39–117)
BILIRUBIN TOTAL: 0.3 mg/dL (ref 0.2–1.2)
BUN: 16 mg/dL (ref 6–23)
CHLORIDE: 100 meq/L (ref 96–112)
CO2: 28 meq/L (ref 19–32)
CREATININE: 0.67 mg/dL (ref 0.40–1.20)
Calcium: 10.2 mg/dL (ref 8.4–10.5)
GFR: 105.27 mL/min (ref >60.00)
Glucose, Bld: 131 mg/dL — ABNORMAL HIGH (ref 70–99)
Potassium: 4.2 mEq/L (ref 3.5–5.1)
Sodium: 136 mEq/L (ref 135–145)
Total Protein: 7.5 g/dL (ref 6.0–8.3)

## 2017-02-05 LAB — TSH: TSH: 3.91 u[IU]/mL (ref 0.35–4.50)

## 2017-02-05 LAB — HEMOGLOBIN A1C: Hgb A1c MFr Bld: 8.6 % — ABNORMAL HIGH (ref 4.6–6.5)

## 2017-02-05 LAB — T4, FREE: FREE T4: 0.75 ng/dL (ref 0.60–1.60)

## 2017-02-05 LAB — LIPID PANEL
Cholesterol: 248 mg/dL — ABNORMAL HIGH (ref 0–200)
HDL: 37 mg/dL — AB (ref >39.00)
Total CHOL/HDL Ratio: 7

## 2017-02-05 LAB — LDL CHOLESTEROL, DIRECT: Direct LDL: 146 mg/dL

## 2017-02-05 LAB — VITAMIN D 25 HYDROXY (VIT D DEFICIENCY, FRACTURES): VITD: 17.59 ng/mL — AB (ref 30.00–100.00)

## 2017-02-05 LAB — T3, FREE: T3 FREE: 3.2 pg/mL (ref 2.3–4.2)

## 2017-02-06 ENCOUNTER — Telehealth: Payer: Self-pay | Admitting: Family Medicine

## 2017-02-06 NOTE — Telephone Encounter (Signed)
Patient is asking to be called back about her lab results.

## 2017-02-06 NOTE — Telephone Encounter (Signed)
Let pt know that her sugar was elevated  And she has new diagnosis of diabetes. Also cholesterol  elevated... Have Her make appt to discuss these in detail as soon as possible.   In meantime work on low carb, low fat diet.

## 2017-02-07 NOTE — Telephone Encounter (Signed)
Silvestre MesiCristina notified as instructed by telephone.  Appointment scheduled for 02/21/2017 at 9:45 am with Dr. Ermalene SearingBedsole.

## 2017-02-21 ENCOUNTER — Encounter: Payer: Self-pay | Admitting: Family Medicine

## 2017-02-21 ENCOUNTER — Ambulatory Visit (INDEPENDENT_AMBULATORY_CARE_PROVIDER_SITE_OTHER): Payer: BC Managed Care – PPO | Admitting: Family Medicine

## 2017-02-21 VITALS — BP 114/68 | HR 77 | Temp 98.3°F | Ht 67.0 in | Wt 214.5 lb

## 2017-02-21 DIAGNOSIS — E7800 Pure hypercholesterolemia, unspecified: Secondary | ICD-10-CM | POA: Diagnosis not present

## 2017-02-21 DIAGNOSIS — E119 Type 2 diabetes mellitus without complications: Secondary | ICD-10-CM | POA: Insufficient documentation

## 2017-02-21 DIAGNOSIS — R61 Generalized hyperhidrosis: Secondary | ICD-10-CM | POA: Diagnosis not present

## 2017-02-21 LAB — MICROALBUMIN / CREATININE URINE RATIO
Creatinine,U: 205.7 mg/dL
Microalb Creat Ratio: 3.5 mg/g (ref 0.0–30.0)
Microalb, Ur: 7.2 mg/dL — ABNORMAL HIGH (ref 0.0–1.9)

## 2017-02-21 LAB — HM DIABETES FOOT EXAM

## 2017-02-21 MED ORDER — METFORMIN HCL ER 500 MG PO TB24: 500.0000 mg | ORAL_TABLET | Freq: Every day | ORAL | 3 refills | Status: DC

## 2017-02-21 NOTE — Assessment & Plan Note (Signed)
Start metformin 500 mg Daily, refer to nutritionist.  Info on DM complicaitons, lifestyle changes and goals reviewed and info given to pt to read.  Follow up labs in 3 months.

## 2017-02-21 NOTE — Assessment & Plan Note (Signed)
Likely due to elevated sugar

## 2017-02-21 NOTE — Assessment & Plan Note (Signed)
Reviewed low cholesterol diet. Info given. Trigs should come down with sugar. Encouraged exercise, weight loss, healthy eating habits.

## 2017-02-21 NOTE — Progress Notes (Signed)
   Subjective:    Patient ID: Erica Singh, female    DOB: 22-May-1981, 36 y.o.   MRN: [id number]  HPI    36 year old female with history of gestational DM presents  To discuss new diagnosis of DM. She has been obese, h of PCOS and prediabetic in past, but has now progressed to DM. Recent lab work showed:  Lab Results  Component Value Date   HGBA1C 8.6 (H) 02/04/2017   Today she reports:  She continues to be tired, and night sweats. Reviewed labs in detail.  She has decreased carbs in diet, started walking daily. Plans starting water aerobics.   Review of Systems  Constitutional: Positive for fatigue. Negative for fever.  HENT: Negative for ear pain.   Eyes: Negative for pain.  Respiratory: Negative for chest tightness and shortness of breath.   Cardiovascular: Negative for chest pain, palpitations and leg swelling.  Gastrointestinal: Negative for abdominal pain.  Genitourinary: Negative for dysuria.       Objective:   Physical Exam  Constitutional: Vital signs are normal. She appears well-developed and well-nourished. She is cooperative.  Non-toxic appearance. She does not appear ill. No distress.  normal  HENT:  Head: Normocephalic.  Right Ear: Hearing, tympanic membrane, external ear and ear canal normal. Tympanic membrane is not erythematous, not retracted and not bulging.  Left Ear: Hearing, tympanic membrane, external ear and ear canal normal. Tympanic membrane is not erythematous, not retracted and not bulging.  Nose: No mucosal edema or rhinorrhea. Right sinus exhibits no maxillary sinus tenderness and no frontal sinus tenderness. Left sinus exhibits no maxillary sinus tenderness and no frontal sinus tenderness.  Mouth/Throat: Uvula is midline, oropharynx is clear and moist and mucous membranes are normal.  Eyes: Conjunctivae, EOM and lids are normal. Pupils are equal, round, and reactive to light. Lids are everted and swept, no foreign bodies found.  Neck:  Trachea normal and normal range of motion. Neck supple. Carotid bruit is not present. No thyroid mass and no thyromegaly present.  Cardiovascular: Normal rate, regular rhythm, S1 normal, S2 normal, normal heart sounds, intact distal pulses and normal pulses.  Exam reveals no gallop and no friction rub.   No murmur heard. Pulmonary/Chest: Effort normal and breath sounds normal. No tachypnea. No respiratory distress. She has no decreased breath sounds. She has no wheezes. She has no rhonchi. She has no rales.  Abdominal: Soft. Normal appearance and bowel sounds are normal. There is no tenderness.  Neurological: She is alert.  Skin: Skin is warm, dry and intact. No rash noted.  Psychiatric: Her speech is normal and behavior is normal. Judgment and thought content normal. Her mood appears not anxious. Cognition and memory are normal. She does not exhibit a depressed mood.   Diabetic foot exam: Normal inspection No skin breakdown No calluses  Normal DP pulses Normal sensation to light touch and monofilament Nails normal        Assessment & Plan:

## 2017-02-21 NOTE — Patient Instructions (Addendum)
Please stop at the front desk to set up referral to nutritionist.  Start metformin once daily.  Work on increasing exercise, water aerobic is a great idea.  Call if you need a prescription for test strip and glucometer.

## 2017-03-18 ENCOUNTER — Other Ambulatory Visit: Payer: Self-pay | Admitting: Family Medicine

## 2017-03-20 ENCOUNTER — Other Ambulatory Visit: Payer: Self-pay | Admitting: *Deleted

## 2017-03-20 MED ORDER — ELETRIPTAN HYDROBROMIDE 40 MG PO TABS: 40.0000 mg | ORAL_TABLET | ORAL | 2 refills | Status: DC | PRN

## 2017-03-20 NOTE — Telephone Encounter (Signed)
Received fax from [facility] requesting Rx for generic Relpax.  Brand name cost [phone].

## 2017-05-16 ENCOUNTER — Telehealth: Payer: Self-pay | Admitting: Family Medicine

## 2017-05-16 ENCOUNTER — Other Ambulatory Visit (INDEPENDENT_AMBULATORY_CARE_PROVIDER_SITE_OTHER): Payer: BC Managed Care – PPO

## 2017-05-16 DIAGNOSIS — E7800 Pure hypercholesterolemia, unspecified: Secondary | ICD-10-CM

## 2017-05-16 DIAGNOSIS — E119 Type 2 diabetes mellitus without complications: Secondary | ICD-10-CM

## 2017-05-16 DIAGNOSIS — E559 Vitamin D deficiency, unspecified: Secondary | ICD-10-CM

## 2017-05-16 LAB — HEMOGLOBIN A1C: Hgb A1c MFr Bld: 6.7 % — ABNORMAL HIGH (ref 4.6–6.5)

## 2017-05-16 LAB — COMPREHENSIVE METABOLIC PANEL
ALT: 11 U/L (ref 0–35)
AST: 10 U/L (ref 0–37)
Albumin: 4.1 g/dL (ref 3.5–5.2)
Alkaline Phosphatase: 61 U/L (ref 39–117)
BILIRUBIN TOTAL: 0.4 mg/dL (ref 0.2–1.2)
BUN: 13 mg/dL (ref 6–23)
CO2: 26 meq/L (ref 19–32)
Calcium: 9.6 mg/dL (ref 8.4–10.5)
Chloride: 103 mEq/L (ref 96–112)
Creatinine, Ser: 0.69 mg/dL (ref 0.40–1.20)
GFR: 101.61 mL/min (ref >60.00)
GLUCOSE: 147 mg/dL — AB (ref 70–99)
Potassium: 4.2 mEq/L (ref 3.5–5.1)
Sodium: 137 mEq/L (ref 135–145)
Total Protein: 6.8 g/dL (ref 6.0–8.3)

## 2017-05-16 LAB — LIPID PANEL
CHOL/HDL RATIO: 5
Cholesterol: 201 mg/dL — ABNORMAL HIGH (ref 0–200)
HDL: 39.7 mg/dL (ref >39.00)
LDL Cholesterol: 129 mg/dL — ABNORMAL HIGH (ref 0–99)
NONHDL: 161.05
Triglycerides: 161 mg/dL — ABNORMAL HIGH (ref 0.0–149.0)
VLDL: 32.2 mg/dL (ref 0.0–40.0)

## 2017-05-16 NOTE — Telephone Encounter (Signed)
-----   Message from Alvina Chou sent at 05/07/2017  4:23 PM EDT ----- Regarding: Lab orders for Friday, 9.21.18 Lab orders for a 3 month follow up appt.

## 2017-05-20 ENCOUNTER — Other Ambulatory Visit: Payer: Self-pay | Admitting: Family Medicine

## 2017-05-23 ENCOUNTER — Ambulatory Visit: Payer: BC Managed Care – PPO | Admitting: Family Medicine

## 2017-06-06 ENCOUNTER — Ambulatory Visit: Payer: BC Managed Care – PPO | Admitting: Family Medicine

## 2017-08-07 ENCOUNTER — Other Ambulatory Visit: Payer: Self-pay

## 2017-08-07 ENCOUNTER — Encounter: Payer: Self-pay | Admitting: Family Medicine

## 2017-08-07 ENCOUNTER — Ambulatory Visit: Payer: BC Managed Care – PPO | Admitting: Family Medicine

## 2017-08-07 VITALS — BP 100/70 | HR 76 | Temp 98.2°F | Ht 67.0 in | Wt 204.0 lb

## 2017-08-07 DIAGNOSIS — M7061 Trochanteric bursitis, right hip: Secondary | ICD-10-CM | POA: Diagnosis not present

## 2017-08-07 DIAGNOSIS — E119 Type 2 diabetes mellitus without complications: Secondary | ICD-10-CM

## 2017-08-07 DIAGNOSIS — E7800 Pure hypercholesterolemia, unspecified: Secondary | ICD-10-CM | POA: Diagnosis not present

## 2017-08-07 LAB — HM DIABETES FOOT EXAM

## 2017-08-07 LAB — COMPREHENSIVE METABOLIC PANEL
ALBUMIN: 4.1 g/dL (ref 3.5–5.2)
ALK PHOS: 64 U/L (ref 39–117)
ALT: 13 U/L (ref 0–35)
AST: 12 U/L (ref 0–37)
BILIRUBIN TOTAL: 0.4 mg/dL (ref 0.2–1.2)
BUN: 11 mg/dL (ref 6–23)
CALCIUM: 8.8 mg/dL (ref 8.4–10.5)
CO2: 28 mEq/L (ref 19–32)
Chloride: 104 mEq/L (ref 96–112)
Creatinine, Ser: 0.66 mg/dL (ref 0.40–1.20)
GFR: 106.82 mL/min (ref >60.00)
Glucose, Bld: 112 mg/dL — ABNORMAL HIGH (ref 70–99)
POTASSIUM: 4.1 meq/L (ref 3.5–5.1)
Sodium: 138 mEq/L (ref 135–145)
TOTAL PROTEIN: 7.2 g/dL (ref 6.0–8.3)

## 2017-08-07 LAB — LIPID PANEL
CHOLESTEROL: 186 mg/dL (ref 0–200)
HDL: 41.5 mg/dL (ref >39.00)
LDL Cholesterol: 120 mg/dL — ABNORMAL HIGH (ref 0–99)
NONHDL: 144.96
TRIGLYCERIDES: 127 mg/dL (ref 0.0–149.0)
Total CHOL/HDL Ratio: 4
VLDL: 25.4 mg/dL (ref 0.0–40.0)

## 2017-08-07 LAB — HEMOGLOBIN A1C: Hgb A1c MFr Bld: 7 % — ABNORMAL HIGH (ref 4.6–6.5)

## 2017-08-07 NOTE — Patient Instructions (Addendum)
Please stop at the lab to have labs drawn. Set up yearly eye exam. Start ibuprofen 800 mg three times daily for pain in right hip. Take on full stomach.  Start home PT. Call if not better in 2 weeks.  Trochanteric Bursitis Trochanteric bursitis is a condition that causes hip pain. Trochanteric bursitis happens when fluid-filled sacs (bursae) in the hip get irritated. Normally these sacs absorb shock and help strong bands of tissue (tendons) in your hip glide smoothly over each other and over your hip bones. What are the causes? This condition results from increased friction between the hip bones and the tendons that go over them. This condition can happen if you:  Have weak hips.  Use your hip muscles too much (overuse).  Get hit in the hip.  What increases the risk? This condition is more likely to develop in:  Women.  Adults who are middle-aged or older.  People with arthritis or a spinal condition.  People with weak buttocks muscles (gluteal muscles).  People who have one leg that is shorter than the other.  People who participate in certain kinds of athletic activities, such as: ? Running sports, especially long-distance running. ? Contact sports, like football or martial arts. ? Sports in which falls may occur, like skiing.  What are the signs or symptoms? The main symptom of this condition is pain and tenderness over the point of your hip. The pain may be:  Sharp and intense.  Dull and achy.  Felt on the outside of your thigh.  It may increase when you:  Lie on your side.  Walk or run.  Go up on stairs.  Sit.  Stand up after sitting.  Stand for long periods of time.  How is this diagnosed? This condition may be diagnosed based on:  Your symptoms.  Your medical history.  A physical exam.  Imaging tests, such as: ? X-rays to check your bones. ? An MRI or ultrasound to check your tendons and muscles.  During your physical exam, your health  care provider will check the movement and strength of your hip. He or she may press on the point of your hip to check for pain. How is this treated? This condition may be treated by:  Resting.  Reducing your activity.  Avoiding activities that cause pain.  Using crutches, a cane, or a walker to decrease the strain on your hip.  Taking medicine to help with swelling.  Having medicine injected into the bursae to help with swelling.  Using ice, heat, and massage therapy for pain relief.  Physical therapy exercises for strength and flexibility.  Surgery (rare).  Follow these instructions at home: Activity  Rest.  Avoid activities that cause pain.  Return to your normal activities as told by your health care provider. Ask your health care provider what activities are safe for you. Managing pain, stiffness, and swelling  Take over-the-counter and prescription medicines only as told by your health care provider.  If directed, apply heat to the injured area as told by your health care provider. ? Place a towel between your skin and the heat source. ? Leave the heat on for 20-30 minutes. ? Remove the heat if your skin turns bright red. This is especially important if you are unable to feel pain, heat, or cold. You may have a greater risk of getting burned.  If directed, apply ice to the injured area: ? Put ice in a plastic bag. ? Place a towel between your  skin and the bag. ? Leave the ice on for 20 minutes, 2-3 times a day. General instructions  If the affected leg is one that you use for driving, ask your health care provider when it is safe to drive.  Use crutches, a cane, or a walker as told by your health care provider.  If one of your legs is shorter than the other, get fitted for a shoe insert.  Lose weight if you are overweight. How is this prevented?  Wear supportive footwear that is appropriate for your sport.  If you have hip pain, start any new exercise or  sport slowly.  Maintain physical fitness, including: ? Strength. ? Flexibility. Contact a health care provider if:  Your pain does not improve with 2-4 weeks. Get help right away if:  You develop severe pain.  You have a fever.  You develop increased redness over your hip.  You have a change in your bowel function or bladder function.  You cannot control the muscles in your feet. This information is not intended to replace advice given to you by your health care provider. Make sure you discuss any questions you have with your health care provider. Document Released: 09/19/2004 Document Revised: 04/17/2016 Document Reviewed: 07/28/2015 Elsevier Interactive Patient Education  [employer]2018 [employer]

## 2017-08-07 NOTE — Progress Notes (Signed)
Subjective:    Patient ID: Erica Singh, female    DOB: 06-12-1981, 36 y.o.   MRN: [id number]  HPI   36 year old female presents for DM and cholesterol follow up.  Diabetes:   On metformin 500 mg daily. Much improved A1C in 04/2017 down to 6.7 from 8.6  has seen nutritionist Lab Results  Component Value Date   HGBA1C 6.7 (H) 05/16/2017  Using medications without difficulties: none Hypoglycemic episodes: none Hyperglycemic episodes: none Feet problems: none Blood Sugars averaging: FBS 150-160 eye exam within last year: due     Body mass index is 31.95 kg/m.  Wt Readings from Last 3 Encounters:  08/07/17 204 lb (92.5 kg)  02/21/17 214 lb 8 oz (97.3 kg)  02/04/17 216 lb 12 oz (98.3 kg)     Elevated Cholesterol:  Due for re-eval. Lab Results  Component Value Date   CHOL 201 (H) 05/16/2017   HDL 39.70 05/16/2017   LDLCALC 129 (H) 05/16/2017   LDLDIRECT 146.0 02/04/2017   TRIG 161.0 (H) 05/16/2017   CHOLHDL 5 05/16/2017  Using medications without problems: Muscle aches:  Diet compliance: low cholesterol diet Exercise:  Yes.. 3 times a week Other complaints:    right lateral hip pain.Marland Kitchen. Hurts most after being up on feet, cannot lie on that side.  Started after knee pain issue ( now resolved) No falls.  no radiation of pain, no numbness, no weakness. Using ibuprofen or aleve.Marland Kitchen. Helps for a while.  Blood pressure 100/70, pulse 76, temperature 98.2 F (36.8 C), temperature source Oral, height 5\' 7"  (1.702 m), weight 204 lb (92.5 kg), unknown if currently breastfeeding.   Review of Systems  Constitutional: Negative for fatigue and fever.  HENT: Negative for congestion.   Eyes: Negative for pain.  Respiratory: Negative for cough and shortness of breath.   Cardiovascular: Negative for chest pain, palpitations and leg swelling.  Gastrointestinal: Negative for abdominal pain.  Genitourinary: Negative for dysuria and vaginal bleeding.  Musculoskeletal:  Negative for back pain.  Neurological: Negative for syncope, light-headedness and headaches.  Psychiatric/Behavioral: Negative for dysphoric mood.       Objective:   Physical Exam  Constitutional: Vital signs are normal. She appears well-developed and well-nourished. She is cooperative.  Non-toxic appearance. She does not appear ill. No distress.  HENT:  Head: Normocephalic.  Right Ear: Hearing, tympanic membrane, external ear and ear canal normal. Tympanic membrane is not erythematous, not retracted and not bulging.  Left Ear: Hearing, tympanic membrane, external ear and ear canal normal. Tympanic membrane is not erythematous, not retracted and not bulging.  Nose: No mucosal edema or rhinorrhea. Right sinus exhibits no maxillary sinus tenderness and no frontal sinus tenderness. Left sinus exhibits no maxillary sinus tenderness and no frontal sinus tenderness.  Mouth/Throat: Uvula is midline, oropharynx is clear and moist and mucous membranes are normal.  Eyes: Conjunctivae, EOM and lids are normal. Pupils are equal, round, and reactive to light. Lids are everted and swept, no foreign bodies found.  Neck: Trachea normal and normal range of motion. Neck supple. Carotid bruit is not present. No thyroid mass and no thyromegaly present.  Cardiovascular: Normal rate, regular rhythm, S1 normal, S2 normal, normal heart sounds, intact distal pulses and normal pulses. Exam reveals no gallop and no friction rub.  No murmur heard. Pulmonary/Chest: Effort normal and breath sounds normal. No tachypnea. No respiratory distress. She has no decreased breath sounds. She has no wheezes. She has no rhonchi. She has no  rales.  Abdominal: Soft. Normal appearance and bowel sounds are normal. There is no tenderness.  Musculoskeletal:       Right hip: She exhibits normal range of motion, normal strength, no tenderness and no bony tenderness.       Lumbar back: Normal. She exhibits normal range of motion, no  tenderness and no bony tenderness.  ttp over right lateral hip at bursa  Neurological: She is alert.  Skin: Skin is warm, dry and intact. No rash noted.  Psychiatric: Her speech is normal and behavior is normal. Judgment and thought content normal. Her mood appears not anxious. Cognition and memory are normal. She does not exhibit a depressed mood.    Diabetic foot exam: Normal inspection No skin breakdown No calluses  Normal DP pulses Normal sensation to light touch and monofilament Nails normal       Assessment & Plan:

## 2017-08-07 NOTE — Assessment & Plan Note (Signed)
Improved control at last check. Pt with lifestyle changes a weight loss.  Recheck labs today.

## 2017-08-07 NOTE — Assessment & Plan Note (Addendum)
Treat with NSAIDs, ice/heat and home PT.  if not improving consider X-ray and steroid injection.

## 2017-08-07 NOTE — Assessment & Plan Note (Signed)
Due for re-eval. If not at goal start statin.

## 2017-09-08 ENCOUNTER — Encounter: Payer: Self-pay | Admitting: Family Medicine

## 2017-09-08 DIAGNOSIS — M25551 Pain in right hip: Secondary | ICD-10-CM

## 2017-09-17 ENCOUNTER — Ambulatory Visit (INDEPENDENT_AMBULATORY_CARE_PROVIDER_SITE_OTHER)
Admission: RE | Admit: 2017-09-17 | Discharge: 2017-09-17 | Disposition: A | Discharge status: Home / Self Care | Payer: BC Managed Care – PPO | Source: Ambulatory Visit | Attending: Family Medicine | Admitting: Family Medicine

## 2017-09-17 DIAGNOSIS — M25551 Pain in right hip: Secondary | ICD-10-CM | POA: Diagnosis not present

## 2018-02-25 ENCOUNTER — Other Ambulatory Visit: Payer: Self-pay | Admitting: Family Medicine

## 2018-03-11 ENCOUNTER — Encounter: Payer: Self-pay | Admitting: Family Medicine

## 2018-06-02 ENCOUNTER — Other Ambulatory Visit: Payer: Self-pay | Admitting: Family Medicine

## 2018-06-08 ENCOUNTER — Telehealth: Payer: Self-pay | Admitting: Family Medicine

## 2018-06-08 NOTE — Telephone Encounter (Signed)
See below crm Pt had cpx schedule for 10/31 in pm.  Needed to r/s.  Can pt be worked in if so when? Thanks  Copied from [id number]. Topic: General - Other >> Jun 08, 2018  1:42 PM Stephannie Li, NT wrote: Reason for CRM: Patient called back there are no appointments available soon and she would like to have her cpe as soon as possible , please call her to schedule , ok schedule and then leave a message with the time and day  ,please call her at [phone]

## 2018-06-08 NOTE — Telephone Encounter (Signed)
See if she can come in on Friday 06/12/18 at 4:00 pm.

## 2018-06-08 NOTE — Telephone Encounter (Signed)
Pt aware Labs 10/17 cpx 10/18

## 2018-06-11 ENCOUNTER — Other Ambulatory Visit (INDEPENDENT_AMBULATORY_CARE_PROVIDER_SITE_OTHER): Payer: BC Managed Care – PPO

## 2018-06-11 ENCOUNTER — Telehealth: Payer: Self-pay | Admitting: Family Medicine

## 2018-06-11 DIAGNOSIS — E119 Type 2 diabetes mellitus without complications: Secondary | ICD-10-CM

## 2018-06-11 DIAGNOSIS — E559 Vitamin D deficiency, unspecified: Secondary | ICD-10-CM | POA: Diagnosis not present

## 2018-06-11 DIAGNOSIS — E7800 Pure hypercholesterolemia, unspecified: Secondary | ICD-10-CM

## 2018-06-11 LAB — COMPREHENSIVE METABOLIC PANEL
ALT: 10 U/L (ref 0–35)
AST: 8 U/L (ref 0–37)
Albumin: 4.3 g/dL (ref 3.5–5.2)
Alkaline Phosphatase: 72 U/L (ref 39–117)
BUN: 18 mg/dL (ref 6–23)
CO2: 25 mEq/L (ref 19–32)
Calcium: 9.5 mg/dL (ref 8.4–10.5)
Chloride: 102 mEq/L (ref 96–112)
Creatinine, Ser: 0.8 mg/dL (ref 0.40–1.20)
GFR: 85.17 mL/min (ref >60.00)
GLUCOSE: 136 mg/dL — AB (ref 70–99)
POTASSIUM: 4.3 meq/L (ref 3.5–5.1)
Sodium: 138 mEq/L (ref 135–145)
TOTAL PROTEIN: 7.4 g/dL (ref 6.0–8.3)
Total Bilirubin: 0.4 mg/dL (ref 0.2–1.2)

## 2018-06-11 LAB — LIPID PANEL
Cholesterol: 219 mg/dL — ABNORMAL HIGH (ref 0–200)
HDL: 49.8 mg/dL (ref >39.00)
LDL Cholesterol: 141 mg/dL — ABNORMAL HIGH (ref 0–99)
NONHDL: 168.88
Total CHOL/HDL Ratio: 4
Triglycerides: 140 mg/dL (ref 0.0–149.0)
VLDL: 28 mg/dL (ref 0.0–40.0)

## 2018-06-11 LAB — VITAMIN D 25 HYDROXY (VIT D DEFICIENCY, FRACTURES): VITD: 17.47 ng/mL — AB (ref 30.00–100.00)

## 2018-06-11 LAB — HEMOGLOBIN A1C: Hgb A1c MFr Bld: 6.3 % (ref 4.6–6.5)

## 2018-06-11 NOTE — Telephone Encounter (Signed)
-----   Message from Alvina Chou sent at 06/09/2018  8:51 AM EDT ----- Regarding: Lab orders for Thursday, 10.17.19 Patient is scheduled for CPX labs, please order future labs, Thanks , Camelia Eng

## 2018-06-12 ENCOUNTER — Ambulatory Visit (INDEPENDENT_AMBULATORY_CARE_PROVIDER_SITE_OTHER): Payer: BC Managed Care – PPO | Admitting: Family Medicine

## 2018-06-12 ENCOUNTER — Encounter: Payer: Self-pay | Admitting: Family Medicine

## 2018-06-12 VITALS — BP 90/60 | HR 89 | Temp 98.7°F | Ht 66.73 in | Wt 196.8 lb

## 2018-06-12 DIAGNOSIS — Z0001 Encounter for general adult medical examination with abnormal findings: Secondary | ICD-10-CM | POA: Diagnosis not present

## 2018-06-12 DIAGNOSIS — Z23 Encounter for immunization: Secondary | ICD-10-CM | POA: Diagnosis not present

## 2018-06-12 DIAGNOSIS — E7800 Pure hypercholesterolemia, unspecified: Secondary | ICD-10-CM

## 2018-06-12 DIAGNOSIS — F3281 Premenstrual dysphoric disorder: Secondary | ICD-10-CM

## 2018-06-12 DIAGNOSIS — Z0000 Encounter for general adult medical examination without abnormal findings: Secondary | ICD-10-CM

## 2018-06-12 DIAGNOSIS — E119 Type 2 diabetes mellitus without complications: Secondary | ICD-10-CM | POA: Diagnosis not present

## 2018-06-12 MED ORDER — VITAMIN D (ERGOCALCIFEROL) 1.25 MG (50000 UNIT) PO CAPS: 50000.0000 [IU] | ORAL_CAPSULE | ORAL | 3 refills | Status: DC

## 2018-06-12 MED ORDER — SERTRALINE HCL 25 MG PO TABS: ORAL_TABLET | ORAL | 1 refills | Status: DC

## 2018-06-12 NOTE — Progress Notes (Signed)
Subjective:    Patient ID: Erica Singh, female    DOB: 04-21-1981, 37 y.o.   MRN: [id number]  HPI   The patient is here for annual wellness exam and preventative care.     She has been noting increase tearfulness.. worst during menses. Intermittant difficulty sleeping at night. Some early morning waking.  Husband had an affair 5 years ago... Has been involved again lately. Some anxiety and worry. They have not been doing counseling.  PHQ9  6/27.  She has never been on a medication in past before.  She does well on most part.. Main issues are  1 week prior.  Elevated Cholesterol: worsened control not at goal < 100 Using medications without problems: Muscle aches:  Diet compliance: Exercise: Other complaints:  Diabetes:  Now at goal on metformin  Lab Results  Component Value Date   HGBA1C 6.3 06/11/2018  Using medications without difficulties: Hypoglycemic episodes:none Hyperglycemic episodes:none Feet problems: no ulers Blood Sugars averaging:FBS 105-144 eye exam within last year: due scheduled next month.   vit d def remains low despite supplementation, but she did not continue vit D after initial supplementation.  Social History /Family History/Past Medical History reviewed in detail and updated in EMR if needed. Blood pressure 90/60, pulse 89, temperature 98.7 F (37.1 C), temperature source Oral, height 5' 6.73" (1.695 m), weight 196 lb 12 oz (89.2 kg), last menstrual period 06/04/2018, unknown if currently breastfeeding.   Review of Systems  Constitutional: Negative for fatigue and fever.  HENT: Negative for congestion.   Eyes: Negative for pain.  Respiratory: Negative for cough and shortness of breath.   Cardiovascular: Negative for chest pain, palpitations and leg swelling.  Gastrointestinal: Negative for abdominal pain.  Genitourinary: Negative for dysuria and vaginal bleeding.  Musculoskeletal: Negative for back pain.  Neurological: Negative  for syncope, light-headedness and headaches.  Psychiatric/Behavioral: Positive for dysphoric mood and sleep disturbance.       Objective:   Physical Exam  Constitutional: Vital signs are normal. She appears well-developed and well-nourished. She is cooperative.  Non-toxic appearance. She does not appear ill. No distress.  HENT:  Head: Normocephalic.  Right Ear: Hearing, tympanic membrane, external ear and ear canal normal.  Left Ear: Hearing, tympanic membrane, external ear and ear canal normal.  Nose: Nose normal.  Eyes: Pupils are equal, round, and reactive to light. Conjunctivae, EOM and lids are normal. Lids are everted and swept, no foreign bodies found.  Neck: Trachea normal and normal range of motion. Neck supple. Carotid bruit is not present. No thyroid mass and no thyromegaly present.  Cardiovascular: Normal rate, regular rhythm, S1 normal, S2 normal, normal heart sounds and intact distal pulses. Exam reveals no gallop.  No murmur heard. Pulmonary/Chest: Effort normal and breath sounds normal. No respiratory distress. She has no wheezes. She has no rhonchi. She has no rales.  Abdominal: Soft. Normal appearance and bowel sounds are normal. She exhibits no distension, no fluid wave, no abdominal bruit and no mass. There is no hepatosplenomegaly. There is no tenderness. There is no rebound, no guarding and no CVA tenderness. No hernia.  Lymphadenopathy:    She has no cervical adenopathy.    She has no axillary adenopathy.  Neurological: She is alert. She has normal strength. No cranial nerve deficit or sensory deficit.  Skin: Skin is warm, dry and intact. No rash noted.  Psychiatric: Her speech is normal and behavior is normal. Judgment normal. Her mood appears not anxious. Cognition and memory  are normal. She does not exhibit a depressed mood.     Diabetic foot exam: Normal inspection No skin breakdown No calluses  Normal DP pulses Normal sensation to light touch and  monofilament Nails normal      Assessment & Plan:  The patient's preventative maintenance and recommended screening tests for an annual wellness exam were reviewed in full today. Brought up to date unless services declined.  Counselled on the importance of diet, exercise, and its role in overall health and mortality. The patient's FH and SH was reviewed, including their home life, tobacco status, and drug and alcohol status.   PAP/DVE: per GYN. not due for mammograms: sister with Breast cancer age 27.. Start mammo at 39. Vaccines: uptodate , flu at work, wil do PNA today Nonsmoker STD screen: refused.   No early family history of colon cancer.

## 2018-06-12 NOTE — Patient Instructions (Addendum)
Start sertraline 25 mg daily 1 week prior to menses, stop 2 days into cycle.  Call with an update after 2-3 cycles.  Work on low chol low fat diet.Marland KitchenMarland Kitchen

## 2018-06-13 ENCOUNTER — Other Ambulatory Visit: Payer: Self-pay | Admitting: Family Medicine

## 2018-06-13 LAB — MICROALBUMIN, URINE: Microalb, Ur: 4.5 mg/dL

## 2018-06-18 ENCOUNTER — Other Ambulatory Visit: Payer: BC Managed Care – PPO

## 2018-06-25 ENCOUNTER — Encounter: Payer: BC Managed Care – PPO | Admitting: Family Medicine

## 2018-06-26 ENCOUNTER — Other Ambulatory Visit: Payer: Self-pay | Admitting: Family Medicine

## 2018-06-29 ENCOUNTER — Other Ambulatory Visit: Payer: Self-pay | Admitting: Family Medicine

## 2018-06-29 MED ORDER — SERTRALINE HCL 25 MG PO TABS: ORAL_TABLET | ORAL | 0 refills | Status: DC

## 2018-06-29 NOTE — Addendum Note (Signed)
Addended by: Damita Lack on: 06/29/2018 12:19 PM   Modules accepted: Orders

## 2018-07-08 DIAGNOSIS — F3281 Premenstrual dysphoric disorder: Secondary | ICD-10-CM | POA: Insufficient documentation

## 2018-07-08 NOTE — Assessment & Plan Note (Signed)
Treat with sertraline, SSRI, prior to menses.

## 2018-07-08 NOTE — Assessment & Plan Note (Signed)
Not at goal. Encouraged exercise, weight loss, healthy eating habits.  

## 2018-07-08 NOTE — Assessment & Plan Note (Signed)
Now at goal on metformin.

## 2018-09-07 ENCOUNTER — Telehealth: Payer: Self-pay | Admitting: Family Medicine

## 2018-09-07 DIAGNOSIS — E559 Vitamin D deficiency, unspecified: Secondary | ICD-10-CM

## 2018-09-07 DIAGNOSIS — E119 Type 2 diabetes mellitus without complications: Secondary | ICD-10-CM

## 2018-09-07 DIAGNOSIS — E7800 Pure hypercholesterolemia, unspecified: Secondary | ICD-10-CM

## 2018-09-07 NOTE — Telephone Encounter (Signed)
-----   Message from Wendi Maya, RT sent at 08/31/2018 10:42 AM EST ----- Regarding: Lab orders for Tuesday 09/08/18 Please enter 74month follow up labs for 09/08/18. Thanks!

## 2018-09-08 ENCOUNTER — Other Ambulatory Visit (INDEPENDENT_AMBULATORY_CARE_PROVIDER_SITE_OTHER): Payer: BC Managed Care – PPO

## 2018-09-08 DIAGNOSIS — E119 Type 2 diabetes mellitus without complications: Secondary | ICD-10-CM | POA: Diagnosis not present

## 2018-09-08 DIAGNOSIS — E559 Vitamin D deficiency, unspecified: Secondary | ICD-10-CM

## 2018-09-08 LAB — COMPREHENSIVE METABOLIC PANEL
ALK PHOS: 59 U/L (ref 39–117)
ALT: 14 U/L (ref 0–35)
AST: 14 U/L (ref 0–37)
Albumin: 4 g/dL (ref 3.5–5.2)
BILIRUBIN TOTAL: 0.3 mg/dL (ref 0.2–1.2)
BUN: 13 mg/dL (ref 6–23)
CO2: 25 mEq/L (ref 19–32)
Calcium: 9.2 mg/dL (ref 8.4–10.5)
Chloride: 103 mEq/L (ref 96–112)
Creatinine, Ser: 0.73 mg/dL (ref 0.40–1.20)
GFR: 94.54 mL/min (ref >60.00)
GLUCOSE: 143 mg/dL — AB (ref 70–99)
POTASSIUM: 4.6 meq/L (ref 3.5–5.1)
SODIUM: 137 meq/L (ref 135–145)
TOTAL PROTEIN: 6.8 g/dL (ref 6.0–8.3)

## 2018-09-08 LAB — LIPID PANEL
Cholesterol: 203 mg/dL — ABNORMAL HIGH (ref 0–200)
HDL: 44.1 mg/dL (ref >39.00)
LDL Cholesterol: 125 mg/dL — ABNORMAL HIGH (ref 0–99)
NONHDL: 158.64
Total CHOL/HDL Ratio: 5
Triglycerides: 168 mg/dL — ABNORMAL HIGH (ref 0.0–149.0)
VLDL: 33.6 mg/dL (ref 0.0–40.0)

## 2018-09-08 LAB — VITAMIN D 25 HYDROXY (VIT D DEFICIENCY, FRACTURES): VITD: 19.53 ng/mL — ABNORMAL LOW (ref 30.00–100.00)

## 2018-09-08 LAB — HEMOGLOBIN A1C: HEMOGLOBIN A1C: 6.7 % — AB (ref 4.6–6.5)

## 2018-09-11 ENCOUNTER — Encounter: Payer: Self-pay | Admitting: Family Medicine

## 2018-09-11 ENCOUNTER — Ambulatory Visit: Payer: BC Managed Care – PPO | Admitting: Family Medicine

## 2018-09-11 VITALS — BP 90/70 | HR 82 | Temp 98.3°F | Ht 66.75 in | Wt 200.8 lb

## 2018-09-11 DIAGNOSIS — F3281 Premenstrual dysphoric disorder: Secondary | ICD-10-CM | POA: Diagnosis not present

## 2018-09-11 DIAGNOSIS — E559 Vitamin D deficiency, unspecified: Secondary | ICD-10-CM

## 2018-09-11 DIAGNOSIS — E119 Type 2 diabetes mellitus without complications: Secondary | ICD-10-CM

## 2018-09-11 DIAGNOSIS — E7800 Pure hypercholesterolemia, unspecified: Secondary | ICD-10-CM | POA: Diagnosis not present

## 2018-09-11 DIAGNOSIS — E282 Polycystic ovarian syndrome: Secondary | ICD-10-CM

## 2018-09-11 LAB — HM DIABETES FOOT EXAM

## 2018-09-11 MED ORDER — VITAMIN D (ERGOCALCIFEROL) 1.25 MG (50000 UNIT) PO CAPS: 50000.0000 [IU] | ORAL_CAPSULE | ORAL | 3 refills | Status: DC

## 2018-09-11 NOTE — Assessment & Plan Note (Signed)
Continue high dose prescription for at least 3-6 more months.

## 2018-09-11 NOTE — Assessment & Plan Note (Signed)
LDL not at goal < 100 but improved. Continue lifestyle changes.

## 2018-09-11 NOTE — Assessment & Plan Note (Signed)
Improved control  With use prior to menses

## 2018-09-11 NOTE — Assessment & Plan Note (Signed)
Stable control on metformin.  Encouraged exercise, weight loss, healthy eating habits.  

## 2018-09-11 NOTE — Progress Notes (Signed)
Subjective:    Patient ID: Erica Singh, female    DOB: 11-11-1980, 38 y.o.   MRN: [id number]  HPI   38 year old female presents for follow up DM.  Diabetes:   Good control on metformin. Lab Results  Component Value Date   HGBA1C 6.7 (H) 09/08/2018  Using medications without difficulties: well Hypoglycemic episodes:none Hyperglycemic episodes:none Feet problems:no uclers Blood Sugars averaging: FBS 120-140 eye exam within last year: scheduled microalbumin low  Elevated Cholesterol: LDL not at goal  On no med. Using medications without problems: none Muscle aches:  Diet compliance: moderate Exercise: 3 times week Other complaints:   PMDD: using sertraline prior to menses... has helped significantly.  Wt Readings from Last 3 Encounters:  09/11/18 200 lb 12 oz (91.1 kg)  06/12/18 196 lb 12 oz (89.2 kg)  08/07/17 204 lb (92.5 kg)    Vit D low:  Was on rx repletion x 12 weeks... only slightly better  Blood pressure 90/70, pulse 82, temperature 98.3 F (36.8 C), temperature source Oral, height 5' 6.75" (1.695 m), weight 200 lb 12 oz (91.1 kg), unknown if currently breastfeeding. Social History /Family History/Past Medical History reviewed in detail and updated in EMR if needed.  Review of Systems  Constitutional: Negative for fatigue and fever.  HENT: Negative for congestion.   Eyes: Negative for pain.  Respiratory: Negative for cough and shortness of breath.   Cardiovascular: Negative for chest pain, palpitations and leg swelling.  Gastrointestinal: Negative for abdominal pain.  Genitourinary: Negative for dysuria and vaginal bleeding.  Musculoskeletal: Negative for back pain.  Neurological: Negative for syncope, light-headedness and headaches.  Psychiatric/Behavioral: Negative for dysphoric mood.       Objective:   Physical Exam Constitutional:      General: She is not in acute distress.    Appearance: Normal appearance. She is well-developed. She  is not ill-appearing or toxic-appearing.  HENT:     Head: Normocephalic.     Right Ear: Hearing, tympanic membrane, ear canal and external ear normal. Tympanic membrane is not erythematous, retracted or bulging.     Left Ear: Hearing, tympanic membrane, ear canal and external ear normal. Tympanic membrane is not erythematous, retracted or bulging.     Nose: No mucosal edema or rhinorrhea.     Right Sinus: No maxillary sinus tenderness or frontal sinus tenderness.     Left Sinus: No maxillary sinus tenderness or frontal sinus tenderness.     Mouth/Throat:     Pharynx: Uvula midline.  Eyes:     General: Lids are normal. Lids are everted, no foreign bodies appreciated.     Conjunctiva/sclera: Conjunctivae normal.     Pupils: Pupils are equal, round, and reactive to light.  Neck:     Musculoskeletal: Normal range of motion and neck supple.     Thyroid: No thyroid mass or thyromegaly.     Vascular: No carotid bruit.     Trachea: Trachea normal.  Cardiovascular:     Rate and Rhythm: Normal rate and regular rhythm.     Pulses: Normal pulses.     Heart sounds: Normal heart sounds, S1 normal and S2 normal. No murmur. No friction rub. No gallop.   Pulmonary:     Effort: Pulmonary effort is normal. No tachypnea or respiratory distress.     Breath sounds: Normal breath sounds. No decreased breath sounds, wheezing, rhonchi or rales.  Abdominal:     General: Bowel sounds are normal.     Palpations:  Abdomen is soft.     Tenderness: There is no abdominal tenderness.  Skin:    General: Skin is warm and dry.     Findings: No rash.  Neurological:     Mental Status: She is alert.  Psychiatric:        Mood and Affect: Mood is not anxious or depressed.        Speech: Speech normal.        Behavior: Behavior normal. Behavior is cooperative.        Thought Content: Thought content normal.        Judgment: Judgment normal.    Diabetic foot exam: Normal inspection No skin breakdown  great toe  calluses  Normal DP pulses Normal sensation to light touch and monofilament Nails normal        Assessment & Plan:

## 2018-09-21 ENCOUNTER — Encounter: Payer: Self-pay | Admitting: Family Medicine

## 2018-09-23 ENCOUNTER — Other Ambulatory Visit: Payer: Self-pay | Admitting: Family Medicine

## 2018-09-28 ENCOUNTER — Ambulatory Visit: Payer: BC Managed Care – PPO | Admitting: Family Medicine

## 2018-09-28 ENCOUNTER — Encounter: Payer: Self-pay | Admitting: Family Medicine

## 2018-09-28 VITALS — BP 114/80 | HR 81 | Temp 98.6°F | Ht 66.75 in | Wt 199.5 lb

## 2018-09-28 DIAGNOSIS — J069 Acute upper respiratory infection, unspecified: Secondary | ICD-10-CM

## 2018-09-28 DIAGNOSIS — B9789 Other viral agents as the cause of diseases classified elsewhere: Secondary | ICD-10-CM | POA: Diagnosis not present

## 2018-09-28 NOTE — Progress Notes (Signed)
BP 114/80 (BP Location: Left Arm, Patient Position: Sitting, Cuff Size: Normal)   Pulse 81   Temp 98.6 F (37 C) (Oral)   Ht 5' 6.75" (1.695 m)   Wt 199 lb 8 oz (90.5 kg)   LMP 09/20/2018   SpO2 97%   BMI 31.48 kg/m    CC: cough Subjective:    Patient ID: Erica Singh, female    DOB: [birth year], 38 y.o.   MRN: [id number]  HPI: Erica ChalkCristina M Mirian CapuchinGarcia Singh is a [age] y.o. female presenting on 09/28/2018 for Cough (C/o cough, body aches, chills and sore throat. Sxs started about 1 wk ago. Tried ibuprofen. Cough seems to be worsening. )   1+ wk h/o cold sxs - body aches, chills, ST, then developed cough (worse in am and pm). Nonproductive cough - trouble bringing up mucous. Some L earache. Some PNdrainage.   No fevers/chills, tooth pain, no rhinorrhea, HA, dyspnea or wheezing.  Treating with ibuprofen and pseudophed cold/flu.   No h/o asthma. Non smoker.  DM on metformin.      Relevant past medical, surgical, family and social history reviewed and updated as indicated. Interim medical history since our last visit reviewed. Allergies and medications reviewed and updated. Outpatient Medications Prior to Visit  Medication Sig Dispense Refill  . metFORMIN (GLUCOPHAGE-XR) 500 MG 24 hr tablet Take 1 tablet (500 mg total) by mouth daily with breakfast. 90 tablet 1  . propranolol ER (INDERAL LA) 80 MG 24 hr capsule TAKE 1 CAPSULE BY MOUTH EVERY DAY (NEED APPT) 30 capsule 0  . sertraline (ZOLOFT) 25 MG tablet USE 1 WEEK PRIOR TO MENSES AND STOP 2 DAYS INTO MENSES 90 tablet 1  . Vitamin D, Ergocalciferol, (DRISDOL) 1.25 MG (50000 UT) CAPS capsule Take 1 capsule (50,000 Units total) by mouth every 7 (seven) days. 12 capsule 3   No facility-administered medications prior to visit.      Per HPI unless specifically indicated in ROS section below Review of Systems Objective:    BP 114/80 (BP Location: Left Arm, Patient Position: Sitting, Cuff Size: Normal)   Pulse 81   Temp 98.6 F  (37 C) (Oral)   Ht 5' 6.75" (1.695 m)   Wt 199 lb 8 oz (90.5 kg)   LMP 09/20/2018   SpO2 97%   BMI 31.48 kg/m   Wt Readings from Last 3 Encounters:  09/28/18 199 lb 8 oz (90.5 kg)  09/11/18 200 lb 12 oz (91.1 kg)  06/12/18 196 lb 12 oz (89.2 kg)    Physical Exam Vitals signs and nursing note reviewed.  Constitutional:      General: She is not in acute distress.    Appearance: Normal appearance. She is well-developed.  HENT:     Head: Normocephalic and atraumatic.     Right Ear: Hearing, tympanic membrane, ear canal and external ear normal.     Left Ear: Hearing, tympanic membrane, ear canal and external ear normal.     Nose: Congestion present. No mucosal edema or rhinorrhea.     Right Sinus: No maxillary sinus tenderness or frontal sinus tenderness.     Left Sinus: No maxillary sinus tenderness or frontal sinus tenderness.     Mouth/Throat:     Mouth: Mucous membranes are moist.     Pharynx: Uvula midline. No oropharyngeal exudate or posterior oropharyngeal erythema.     Tonsils: No tonsillar abscesses.  Eyes:     General: No scleral icterus.    Conjunctiva/sclera: Conjunctivae normal.  Pupils: Pupils are equal, round, and reactive to light.  Neck:     Musculoskeletal: Normal range of motion and neck supple.  Cardiovascular:     Rate and Rhythm: Normal rate and regular rhythm.     Pulses: Normal pulses.     Heart sounds: Normal heart sounds. No murmur.  Pulmonary:     Effort: Pulmonary effort is normal. No respiratory distress.     Breath sounds: Normal breath sounds. No wheezing, rhonchi or rales.     Comments: Lungs clear. Dry hacking cough present.  Lymphadenopathy:     Cervical: No cervical adenopathy.  Skin:    General: Skin is warm and dry.     Findings: No rash.  Neurological:     Mental Status: She is alert.       Lab Results  Component Value Date   HGBA1C 6.7 (H) 09/08/2018    Assessment & Plan:   Problem List Items Addressed This Visit     Viral URI with cough - Primary    Anticipate viral given no localizing symptoms, initial symptoms improving. Supportive care reviewed. Pt declines cough medicine. rec mucinex and ibuprofen. Red flags to update [place] reviewed. Pt agrees with plan.           No orders of the defined types were placed in this encounter.  No orders of the defined types were placed in this encounter.   Follow up plan: Return if symptoms worsen or fail to improve.  Eustaquio Boyden, MD

## 2018-09-28 NOTE — Patient Instructions (Signed)
You have a viral respiratory infection with persistent post-infectious cough. Antibiotics are not needed for this. Viral infections usually take 7-10 days to resolve. The cough can last a few weeks to go away. Take plain mucinex with large glass of water to help mobilize mucous. Ok to continue ibuprofen.  Push fluids and plenty of rest. Please let us know if you are not improving as expected, or if you have high fevers (>101.5) or worsening productive cough. Call clinic with questions.  Good to see you today. I hope you start feeling better soon.

## 2018-09-28 NOTE — Assessment & Plan Note (Signed)
Anticipate viral given no localizing symptoms, initial symptoms improving. Supportive care reviewed. Pt declines cough medicine. rec mucinex and ibuprofen. Red flags to update [place] reviewed. Pt agrees with plan.

## 2018-10-07 NOTE — Telephone Encounter (Signed)
If she truly has psoriasis, then antifungal ketoconazole won't work.   If she has scalp psoriasis, then the best long-term shampoo is likely Psoriatrax 5% crude coal tar equivalent.  You have to buy on the internet - much stronger than T-gel and most that are sold in local pharmacies.  Good long-term solution.  If psoriasis, a short term burst of topical steroid scalp lotion could be used.  ? If fungal.

## 2018-10-09 ENCOUNTER — Other Ambulatory Visit: Payer: Self-pay | Admitting: Family Medicine

## 2018-10-09 MED ORDER — KETOCONAZOLE 2 % EX SHAM: 1.0000 "application " | MEDICATED_SHAMPOO | CUTANEOUS | 0 refills | Status: DC

## 2018-10-09 NOTE — Telephone Encounter (Signed)
Rx called in 

## 2018-12-09 ENCOUNTER — Telehealth: Payer: Self-pay | Admitting: Family Medicine

## 2018-12-09 NOTE — Telephone Encounter (Signed)
Left message asking pt to call office please schedule dr Ermalene Searing appointment to [email or web address]

## 2018-12-14 ENCOUNTER — Telehealth: Payer: Self-pay

## 2018-12-14 NOTE — Telephone Encounter (Signed)
Left detailed VM w COVID screen and curbside info   

## 2018-12-15 ENCOUNTER — Telehealth: Payer: Self-pay | Admitting: Family Medicine

## 2018-12-15 ENCOUNTER — Other Ambulatory Visit: Payer: BC Managed Care – PPO

## 2018-12-15 ENCOUNTER — Other Ambulatory Visit: Payer: Self-pay

## 2018-12-15 DIAGNOSIS — E7800 Pure hypercholesterolemia, unspecified: Secondary | ICD-10-CM

## 2018-12-15 DIAGNOSIS — E119 Type 2 diabetes mellitus without complications: Secondary | ICD-10-CM

## 2018-12-15 DIAGNOSIS — E559 Vitamin D deficiency, unspecified: Secondary | ICD-10-CM

## 2018-12-15 NOTE — Telephone Encounter (Signed)
-----   Message from Alvina Chou sent at 12/11/2018 10:48 AM EDT ----- Regarding: Lab orders for Tuesday, 4.21.20 Lab orders, thanks

## 2018-12-17 ENCOUNTER — Other Ambulatory Visit (INDEPENDENT_AMBULATORY_CARE_PROVIDER_SITE_OTHER): Payer: BC Managed Care – PPO

## 2018-12-17 ENCOUNTER — Other Ambulatory Visit: Payer: Self-pay

## 2018-12-17 DIAGNOSIS — E119 Type 2 diabetes mellitus without complications: Secondary | ICD-10-CM

## 2018-12-17 DIAGNOSIS — E7800 Pure hypercholesterolemia, unspecified: Secondary | ICD-10-CM | POA: Diagnosis not present

## 2018-12-17 DIAGNOSIS — E559 Vitamin D deficiency, unspecified: Secondary | ICD-10-CM | POA: Diagnosis not present

## 2018-12-17 LAB — COMPREHENSIVE METABOLIC PANEL
ALT: 12 U/L (ref 0–35)
AST: 11 U/L (ref 0–37)
Albumin: 4 g/dL (ref 3.5–5.2)
Alkaline Phosphatase: 65 U/L (ref 39–117)
BUN: 15 mg/dL (ref 6–23)
CO2: 27 mEq/L (ref 19–32)
Calcium: 8.8 mg/dL (ref 8.4–10.5)
Chloride: 105 mEq/L (ref 96–112)
Creatinine, Ser: 0.74 mg/dL (ref 0.40–1.20)
GFR: 87.44 mL/min (ref >60.00)
Glucose, Bld: 125 mg/dL — ABNORMAL HIGH (ref 70–99)
Potassium: 4.1 mEq/L (ref 3.5–5.1)
Sodium: 139 mEq/L (ref 135–145)
Total Bilirubin: 0.3 mg/dL (ref 0.2–1.2)
Total Protein: 6.8 g/dL (ref 6.0–8.3)

## 2018-12-17 LAB — VITAMIN D 25 HYDROXY (VIT D DEFICIENCY, FRACTURES): VITD: 35.98 ng/mL (ref 30.00–100.00)

## 2018-12-17 LAB — LIPID PANEL
Cholesterol: 205 mg/dL — ABNORMAL HIGH (ref 0–200)
HDL: 44.7 mg/dL (ref >39.00)
LDL Cholesterol: 138 mg/dL — ABNORMAL HIGH (ref 0–99)
NonHDL: 160.67
Total CHOL/HDL Ratio: 5
Triglycerides: 111 mg/dL (ref 0.0–149.0)
VLDL: 22.2 mg/dL (ref 0.0–40.0)

## 2018-12-17 LAB — HEMOGLOBIN A1C: Hgb A1c MFr Bld: 6.7 % — ABNORMAL HIGH (ref 4.6–6.5)

## 2018-12-18 ENCOUNTER — Encounter: Payer: Self-pay | Admitting: Family Medicine

## 2018-12-18 ENCOUNTER — Ambulatory Visit (INDEPENDENT_AMBULATORY_CARE_PROVIDER_SITE_OTHER): Payer: BC Managed Care – PPO | Admitting: Family Medicine

## 2018-12-18 VITALS — Ht 66.75 in | Wt 198.0 lb

## 2018-12-18 DIAGNOSIS — E559 Vitamin D deficiency, unspecified: Secondary | ICD-10-CM | POA: Diagnosis not present

## 2018-12-18 DIAGNOSIS — E7800 Pure hypercholesterolemia, unspecified: Secondary | ICD-10-CM

## 2018-12-18 DIAGNOSIS — E119 Type 2 diabetes mellitus without complications: Secondary | ICD-10-CM | POA: Diagnosis not present

## 2018-12-18 MED ORDER — ATORVASTATIN CALCIUM 10 MG PO TABS: 10.0000 mg | ORAL_TABLET | Freq: Every day | ORAL | 3 refills | Status: DC

## 2018-12-18 MED ORDER — VITAMIN D (ERGOCALCIFEROL) 1.25 MG (50000 UNIT) PO CAPS: 50000.0000 [IU] | ORAL_CAPSULE | ORAL | 0 refills | Status: DC

## 2018-12-18 NOTE — Assessment & Plan Note (Signed)
Well controlled. Continue current medication. Encouraged exercise, weight loss, healthy eating habits.  

## 2018-12-18 NOTE — Assessment & Plan Note (Signed)
Start low dose statin and re-eval in 3 months with LFTs.

## 2018-12-18 NOTE — Progress Notes (Signed)
Labs 7/27 follow up 7/31 Pt aware

## 2018-12-18 NOTE — Assessment & Plan Note (Signed)
Now in nml range after 3 motnhs of high dose vit D.

## 2018-12-18 NOTE — Patient Instructions (Addendum)
Set up yearly eye exam as able in next few months.  Repeat vit d X 12 weeks.  Start atorvastain for cholesterol and CVD risk.  Continue exercise, weight loss, healthy eating habits.

## 2018-12-18 NOTE — Progress Notes (Signed)
VIRTUAL VISIT Due to national recommendations of social distancing due to COVID 19, a virtual visit is felt to be most appropriate for this patient at this time.   I connected with the patient on 12/18/18 at  9:40 AM EDT by virtual telehealth platform and verified that I am speaking with the correct person using two identifiers.   I discussed the limitations, risks, security and privacy concerns of performing an evaluation and management service by  virtual telehealth platform and the availability of in person appointments. I also discussed with the patient that there may be a patient responsible charge related to this service. The patient expressed understanding and agreed to proceed.  Patient location: Home Provider Location: [facility] Participants: Erica Singh and Franchot Erichsen   Chief Complaint  Patient presents with  . Diabetes    History of Present Illness: 38 year old female pt presents for DM follow up.  Diabetes:  Good control on metfomrin Lab Results  Component Value Date   HGBA1C 6.7 (H) 12/17/2018  Using medications without difficulties: Hypoglycemic episodes:none Hyperglycemic episodes:none Feet problems:no ulcer Blood Sugars averaging: [id number] eye exam within last year: due  Elevated Cholesterol: Not at goal LDL < 100, statin indicated. Lab Results  Component Value Date   CHOL 205 (H) 12/17/2018   HDL 44.70 12/17/2018   LDLCALC 138 (H) 12/17/2018   LDLDIRECT 146.0 02/04/2017   TRIG 111.0 12/17/2018   CHOLHDL 5 12/17/2018  Diet compliance: low carb low chol diet Exercise: 2-3 times a week Other complaints:   Vit D def: Now in nml range after 3 months of high dose vit D.  COVID 19 screen No recent travel or known exposure to COVID19 The patient denies respiratory symptoms of COVID 19 at this time.  The importance of social distancing was discussed today.   Review of Systems  Constitutional: Negative for chills and fever.  HENT:  Negative for congestion and ear pain.   Eyes: Negative for pain and redness.  Respiratory: Negative for cough and shortness of breath.   Cardiovascular: Negative for chest pain, palpitations and leg swelling.  Gastrointestinal: Negative for abdominal pain, blood in stool, constipation, diarrhea, nausea and vomiting.  Genitourinary: Negative for dysuria.  Musculoskeletal: Negative for falls and myalgias.  Skin: Negative for rash.  Neurological: Negative for dizziness.  Psychiatric/Behavioral: Negative for depression. The patient is not nervous/anxious.       Past Medical History:  Diagnosis Date  . Migraine headache without aura 05/11/2015    reports that she has never smoked. She has never used smokeless tobacco. She reports that she does not drink alcohol or use drugs.   Current Outpatient Medications:  .  ketoconazole (NIZORAL) 2 % shampoo, Apply 1 application topically 2 (two) times a week., Disp: 120 mL, Rfl: 0 .  metFORMIN (GLUCOPHAGE-XR) 500 MG 24 hr tablet, Take 1 tablet (500 mg total) by mouth daily with breakfast., Disp: 90 tablet, Rfl: 1 .  propranolol ER (INDERAL LA) 80 MG 24 hr capsule, TAKE 1 CAPSULE BY MOUTH EVERY DAY (NEED APPT), Disp: 90 capsule, Rfl: 1 .  sertraline (ZOLOFT) 25 MG tablet, USE 1 WEEK PRIOR TO MENSES AND STOP 2 DAYS INTO MENSES, Disp: 90 tablet, Rfl: 1 .  Vitamin D, Ergocalciferol, (DRISDOL) 1.25 MG (50000 UT) CAPS capsule, Take 1 capsule (50,000 Units total) by mouth every 7 (seven) days., Disp: 12 capsule, Rfl: 3   Observations/Objective: Height 5' 6.75" (1.695 m), weight 198 lb (89.8 kg), last menstrual period  12/12/2018, unknown if currently breastfeeding.  Wt Readings from Last 3 Encounters:  12/18/18 198 lb (89.8 kg)  09/28/18 199 lb 8 oz (90.5 kg)  09/11/18 200 lb 12 oz (91.1 kg)    Physical Exam  Physical Exam Constitutional:      General: She is not in acute distress. Pulmonary:     Effort: Pulmonary effort is normal. No respiratory  distress.  Neurological:     Mental Status: She is alert and oriented to person, place, and time.  Psychiatric:        Mood and Affect: Mood normal.        Behavior: Behavior normal.   Assessment and Plan Type 2 diabetes mellitus without complication, without long-term current use of insulin (HCC) Well controlled. Continue current medication. Encouraged exercise, weight loss, healthy eating habits.   Vitamin D deficiency Now in nml range after 3 motnhs of high dose vit D.   HYPERCHOLESTEROLEMIA Start low dose statin and re-eval in 3 months with LFTs.     I discussed the assessment and treatment plan with the patient. The patient was provided an opportunity to ask questions and all were answered. The patient agreed with the plan and demonstrated an understanding of the instructions.   The patient was advised to call back or seek an in-person evaluation if the symptoms worsen or if the condition fails to improve as anticipated.     Erica NoraAmy Rilley Poulter, MD

## 2019-01-31 IMAGING — DX DG HIP (WITH OR WITHOUT PELVIS) 2-3V*R*
3 series · 3 of 3 positions shown · non-contrast
Comparison: None.

CLINICAL DATA: Right hip pain no known injury, initial encounter

EXAM:
DG HIP (WITH OR WITHOUT PELVIS) 3V RIGHT

[pelvis ap]
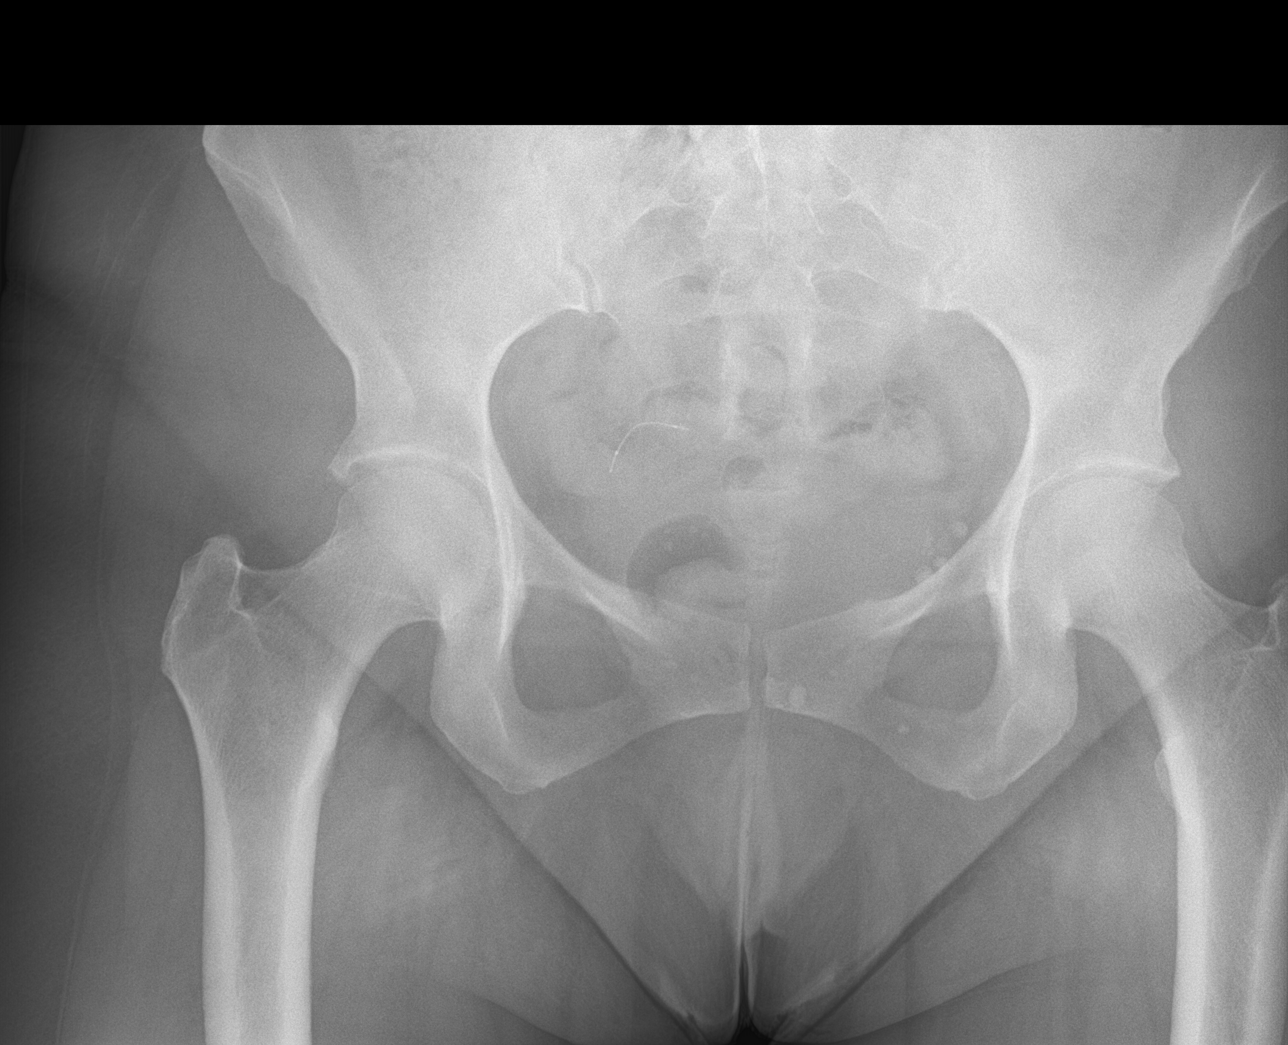

[hip ap]
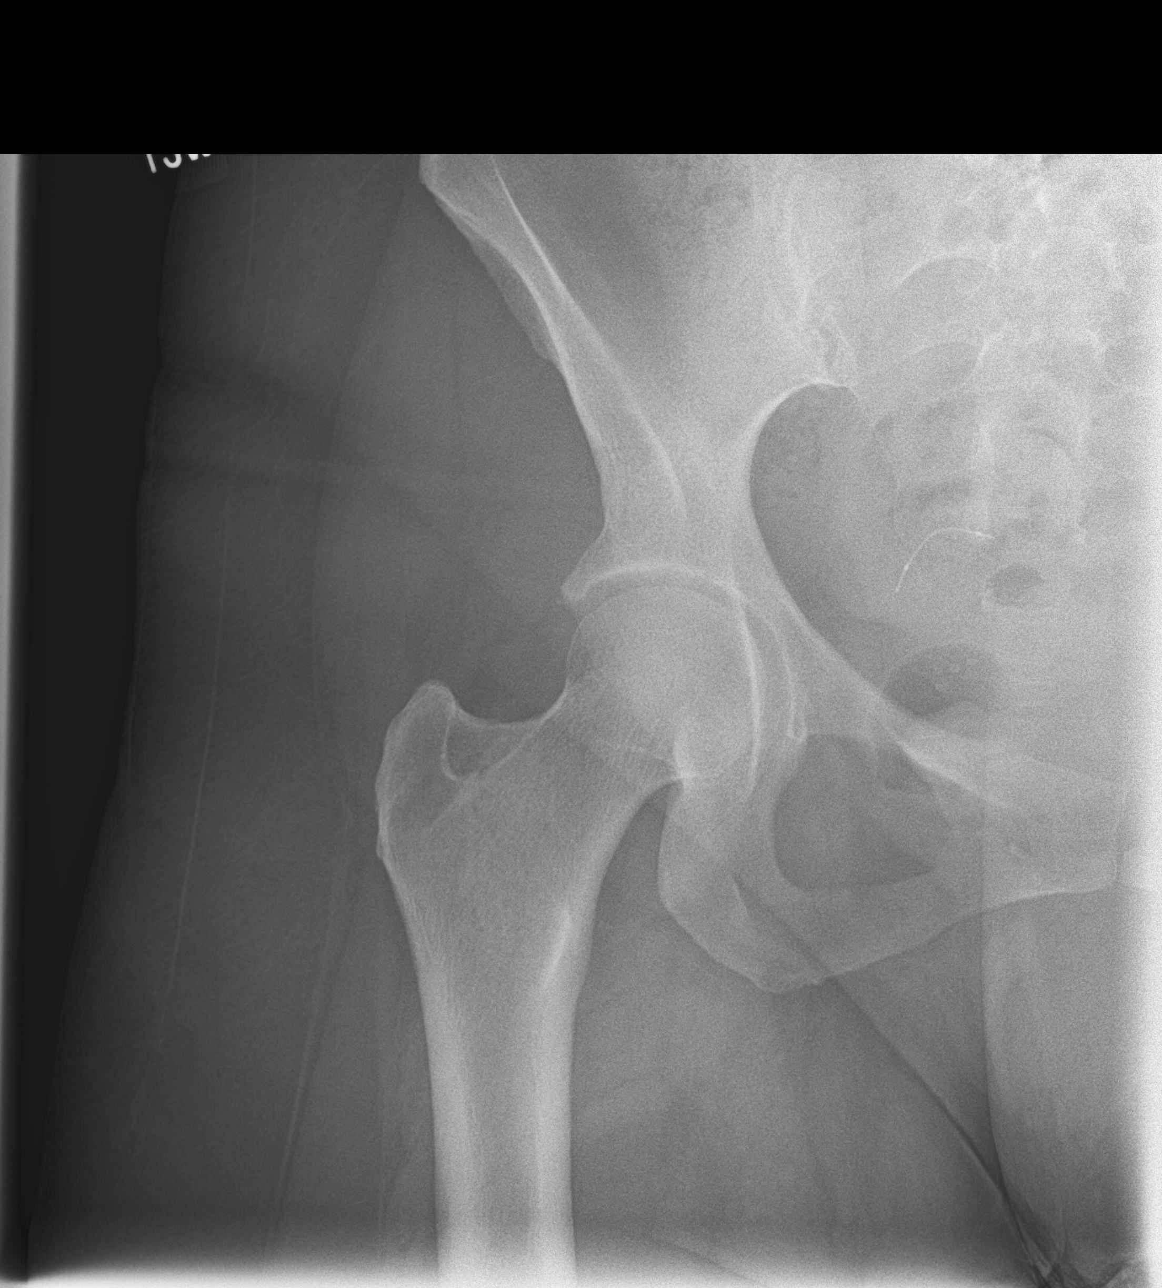

[hip lat]
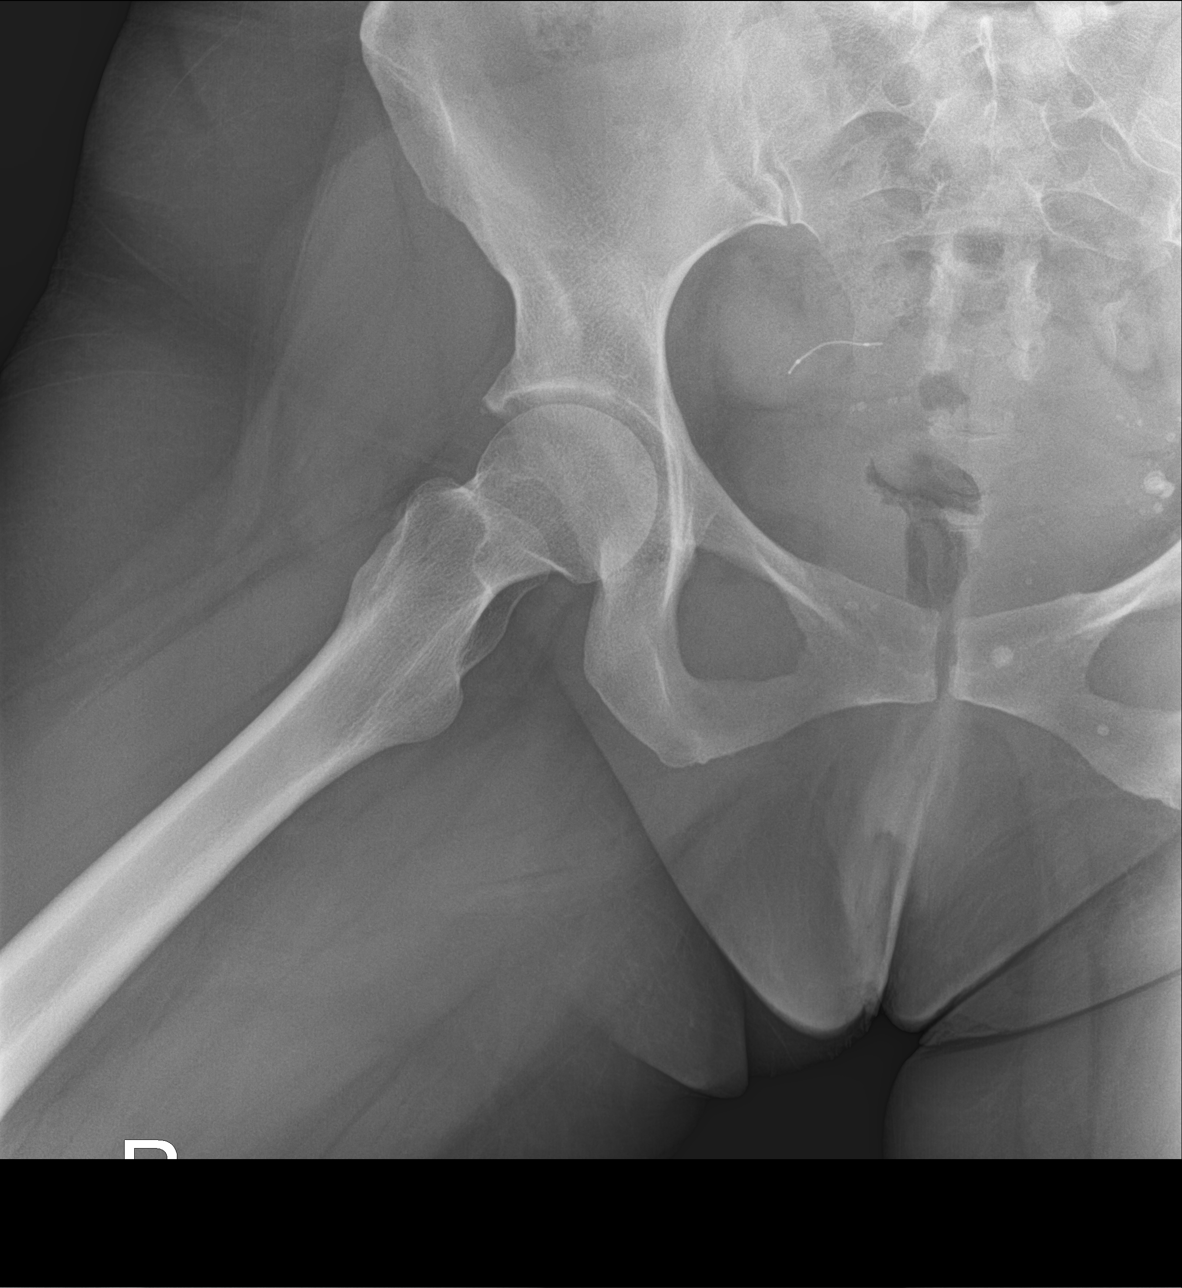

[3 of 3 positions shown; findings below may reference images not displayed]

FINDINGS: The pelvic ring is intact. No acute fracture or dislocation is
noted. Mild overhanging osteophytes are noted superiorly in the
acetabulum. An Essure coil is noted on the right. A coil is not
definitively visualized on the left. Clinical correlation is
recommended.
IMPRESSION: No acute bony abnormality noted.

Right-sided Essure coil as described.

## 2019-03-18 ENCOUNTER — Telehealth: Payer: Self-pay

## 2019-03-18 NOTE — Telephone Encounter (Signed)
Left detailed VM w COVID screen and back door lab info   

## 2019-03-19 ENCOUNTER — Telehealth: Payer: Self-pay | Admitting: Family Medicine

## 2019-03-19 ENCOUNTER — Other Ambulatory Visit (INDEPENDENT_AMBULATORY_CARE_PROVIDER_SITE_OTHER): Payer: BC Managed Care – PPO

## 2019-03-19 DIAGNOSIS — E7800 Pure hypercholesterolemia, unspecified: Secondary | ICD-10-CM

## 2019-03-19 DIAGNOSIS — E119 Type 2 diabetes mellitus without complications: Secondary | ICD-10-CM | POA: Diagnosis not present

## 2019-03-19 LAB — LIPID PANEL
Cholesterol: 147 mg/dL (ref 0–200)
HDL: 42.1 mg/dL (ref >39.00)
LDL Cholesterol: 84 mg/dL (ref 0–99)
NonHDL: 104.89
Total CHOL/HDL Ratio: 3
Triglycerides: 106 mg/dL (ref 0.0–149.0)
VLDL: 21.2 mg/dL (ref 0.0–40.0)

## 2019-03-19 LAB — COMPREHENSIVE METABOLIC PANEL
ALT: 11 U/L (ref 0–35)
AST: 10 U/L (ref 0–37)
Albumin: 4.1 g/dL (ref 3.5–5.2)
Alkaline Phosphatase: 71 U/L (ref 39–117)
BUN: 14 mg/dL (ref 6–23)
CO2: 27 mEq/L (ref 19–32)
Calcium: 9.3 mg/dL (ref 8.4–10.5)
Chloride: 104 mEq/L (ref 96–112)
Creatinine, Ser: 0.69 mg/dL (ref 0.40–1.20)
GFR: 94.67 mL/min (ref >60.00)
Glucose, Bld: 134 mg/dL — ABNORMAL HIGH (ref 70–99)
Potassium: 4 mEq/L (ref 3.5–5.1)
Sodium: 137 mEq/L (ref 135–145)
Total Bilirubin: 0.4 mg/dL (ref 0.2–1.2)
Total Protein: 6.6 g/dL (ref 6.0–8.3)

## 2019-03-19 LAB — HEMOGLOBIN A1C: Hgb A1c MFr Bld: 6.5 % (ref 4.6–6.5)

## 2019-03-19 NOTE — Telephone Encounter (Signed)
-----   Message from Ellamae Sia sent at 03/16/2019 10:31 AM EDT ----- Regarding: Lab orders for Monday, 7.27.20 Lab orders for a 3 month follow up appt.

## 2019-03-22 ENCOUNTER — Other Ambulatory Visit: Payer: BC Managed Care – PPO

## 2019-03-25 LAB — HM DIABETES EYE EXAM

## 2019-03-26 ENCOUNTER — Other Ambulatory Visit: Payer: Self-pay

## 2019-03-26 ENCOUNTER — Ambulatory Visit: Payer: BC Managed Care – PPO | Admitting: Family Medicine

## 2019-03-26 ENCOUNTER — Encounter: Payer: Self-pay | Admitting: Family Medicine

## 2019-03-26 VITALS — BP 90/62 | HR 73 | Temp 98.1°F | Ht 66.75 in | Wt 198.2 lb

## 2019-03-26 DIAGNOSIS — E119 Type 2 diabetes mellitus without complications: Secondary | ICD-10-CM | POA: Diagnosis not present

## 2019-03-26 DIAGNOSIS — E7800 Pure hypercholesterolemia, unspecified: Secondary | ICD-10-CM

## 2019-03-26 DIAGNOSIS — L659 Nonscarring hair loss, unspecified: Secondary | ICD-10-CM

## 2019-03-26 LAB — HM DIABETES FOOT EXAM

## 2019-03-26 LAB — TSH: TSH: 3.97 u[IU]/mL (ref 0.35–4.50)

## 2019-03-26 LAB — T4, FREE: Free T4: 0.97 ng/dL (ref 0.60–1.60)

## 2019-03-26 LAB — T3, FREE: T3, Free: 3.2 pg/mL (ref 2.3–4.2)

## 2019-03-26 NOTE — Assessment & Plan Note (Signed)
Well controlled. Continue current medication.  

## 2019-03-26 NOTE — Progress Notes (Signed)
Chief Complaint  Patient presents with  . Diabetes    History of Present Illness: HPI   38 year old female presents for DM follow up   She has noted thinning hair in the last year Parnets have thinning hair but at later age. Family history of thyroid issues.  hx of psoriasis.  Top and sides... good    Separated from husband in 10/2018   Diabetes:    Lab Results  Component Value Date   HGBA1C 6.5 03/19/2019  Using medications without difficulties: Hypoglycemic episodes: Hyperglycemic episodes: Feet problems: Blood Sugars averaging: eye exam within last year:  Elevated Cholesterol:  LDL now at goal  < 100 on atorvastatin 10 mg daily. Lab Results  Component Value Date   CHOL 147 03/19/2019   HDL 42.10 03/19/2019   LDLCALC 84 03/19/2019   LDLDIRECT 146.0 02/04/2017   TRIG 106.0 03/19/2019   CHOLHDL 3 03/19/2019  Using medications without problems:none Muscle aches: none Diet compliance: good Exercise: walking 2-3 miles  4 days a week Other complaints:   COVID 19 screen No recent travel or known exposure to [place] The patient denies respiratory symptoms of COVID 19 at this time.  The importance of social distancing was discussed today.   Review of Systems  Constitutional: Negative for chills and fever.  HENT: Negative for congestion and ear pain.   Eyes: Negative for pain and redness.  Respiratory: Negative for cough and shortness of breath.   Cardiovascular: Negative for chest pain, palpitations and leg swelling.  Gastrointestinal: Negative for abdominal pain, blood in stool, constipation, diarrhea, nausea and vomiting.  Genitourinary: Negative for dysuria.  Musculoskeletal: Negative for falls and myalgias.  Skin: Negative for rash.  Neurological: Negative for dizziness.  Psychiatric/Behavioral: Negative for depression. The patient is not nervous/anxious.       Past Medical History:  Diagnosis Date  . Migraine headache without aura 05/11/2015    reports that she has never smoked. She has never used smokeless tobacco. She reports that she does not drink alcohol or use drugs.   Current Outpatient Medications:  .  atorvastatin (LIPITOR) 10 MG tablet, Take 1 tablet (10 mg total) by mouth daily., Disp: 90 tablet, Rfl: 3 .  metFORMIN (GLUCOPHAGE-XR) 500 MG 24 hr tablet, Take 1 tablet (500 mg total) by mouth daily with breakfast., Disp: 90 tablet, Rfl: 1 .  propranolol ER (INDERAL LA) 80 MG 24 hr capsule, TAKE 1 CAPSULE BY MOUTH EVERY DAY (NEED APPT), Disp: 90 capsule, Rfl: 1 .  sertraline (ZOLOFT) 25 MG tablet, USE 1 WEEK PRIOR TO MENSES AND STOP 2 DAYS INTO MENSES, Disp: 90 tablet, Rfl: 1 .  Vitamin D, Ergocalciferol, (DRISDOL) 1.25 MG (50000 UT) CAPS capsule, Take 1 capsule (50,000 Units total) by mouth every 7 (seven) days., Disp: 12 capsule, Rfl: 0   Observations/Objective: Blood pressure 90/62, pulse 73, temperature 98.1 F (36.7 C), temperature source Temporal, height 5' 6.75" (1.695 m), weight 198 lb 4 oz (89.9 kg), last menstrual period 03/02/2019, SpO2 97 %, unknown if currently breastfeeding.    BP Readings from Last 3 Encounters:  03/26/19 90/62  09/28/18 114/80  09/11/18 90/70    Wt Readings from Last 3 Encounters:  03/26/19 198 lb 4 oz (89.9 kg)  12/18/18 198 lb (89.8 kg)  09/28/18 199 lb 8 oz (90.5 kg)   Body mass index is 31.28 kg/m.   Physical Exam Constitutional:      General: She is not in acute distress.    Appearance: Normal appearance.  She is well-developed. She is not ill-appearing or toxic-appearing.  HENT:     Head: Normocephalic.     Right Ear: Hearing, tympanic membrane, ear canal and external ear normal. Tympanic membrane is not erythematous, retracted or bulging.     Left Ear: Hearing, tympanic membrane, ear canal and external ear normal. Tympanic membrane is not erythematous, retracted or bulging.     Nose: No mucosal edema or rhinorrhea.     Right Sinus: No maxillary sinus tenderness or frontal  sinus tenderness.     Left Sinus: No maxillary sinus tenderness or frontal sinus tenderness.     Mouth/Throat:     Pharynx: Uvula midline.  Eyes:     General: Lids are normal. Lids are everted, no foreign bodies appreciated.     Conjunctiva/sclera: Conjunctivae normal.     Pupils: Pupils are equal, round, and reactive to light.  Neck:     Musculoskeletal: Normal range of motion and neck supple.     Thyroid: No thyroid mass or thyromegaly.     Vascular: No carotid bruit.     Trachea: Trachea normal.  Cardiovascular:     Rate and Rhythm: Normal rate and regular rhythm.     Pulses: Normal pulses.     Heart sounds: Normal heart sounds, S1 normal and S2 normal. No murmur. No friction rub. No gallop.   Pulmonary:     Effort: Pulmonary effort is normal. No tachypnea or respiratory distress.     Breath sounds: Normal breath sounds. No decreased breath sounds, wheezing, rhonchi or rales.  Abdominal:     General: Bowel sounds are normal.     Palpations: Abdomen is soft.     Tenderness: There is no abdominal tenderness.  Skin:    General: Skin is warm and dry.     Findings: No rash.  Neurological:     Mental Status: She is alert.  Psychiatric:        Mood and Affect: Mood is not anxious or depressed.        Speech: Speech normal.        Behavior: Behavior normal. Behavior is cooperative.        Thought Content: Thought content normal.        Judgment: Judgment normal.    Hair thinning on sides and especially top of head. Hair does not come out easily. No rash noted.  Diabetic foot exam: Normal inspection No skin breakdown No calluses  Normal DP pulses Normal sensation to light touch and monofilament Nails normal  Assessment and Plan      Kerby NoraAmy Annaleah Arata, MD

## 2019-03-26 NOTE — Patient Instructions (Addendum)
Continue healthy lifestyle.Marland Kitchen exercise and low carb, low fat diet.

## 2019-03-26 NOTE — Assessment & Plan Note (Addendum)
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  Eval thyroid.. consider derm referral ( psoriasis is in different location than thinning hair)

## 2019-03-26 NOTE — Assessment & Plan Note (Signed)
Now at goal LDL < 100 on statin.

## 2019-05-08 ENCOUNTER — Other Ambulatory Visit: Payer: Self-pay | Admitting: Family Medicine

## 2019-06-10 ENCOUNTER — Other Ambulatory Visit: Payer: Self-pay | Admitting: Family Medicine

## 2019-06-10 NOTE — Telephone Encounter (Signed)
Last office visit 03/26/2019 for DM.  Last refilled 12/18/2018 for #12 with no refills.  Last Vit D level was normal at 35.98 ng/ml on 12/17/2018.  CPE scheduled 10/01/2019.  Refill?

## 2019-07-01 ENCOUNTER — Other Ambulatory Visit: Payer: Self-pay

## 2019-07-01 ENCOUNTER — Ambulatory Visit: Payer: BC Managed Care – PPO | Admitting: Internal Medicine

## 2019-07-01 ENCOUNTER — Encounter: Payer: Self-pay | Admitting: Internal Medicine

## 2019-07-01 VITALS — BP 102/68 | HR 84 | Temp 98.0°F | Wt 194.0 lb

## 2019-07-01 DIAGNOSIS — N898 Other specified noninflammatory disorders of vagina: Secondary | ICD-10-CM | POA: Diagnosis not present

## 2019-07-01 MED ORDER — TRIAMCINOLONE ACETONIDE 0.5 % EX OINT: 1.0000 "application " | TOPICAL_OINTMENT | Freq: Two times a day (BID) | CUTANEOUS | 0 refills | Status: DC

## 2019-07-01 NOTE — Progress Notes (Signed)
Subjective:    Patient ID: Erica Singh, female    DOB: 1980/10/14, 38 y.o.   MRN: [id number]  HPI  Pt presents to the clinic today with c/o vaginal itiching and buring that has been ongoing for 5 days. She has had brazilian wax and  noted she has had dryness and irritation after that. She is also having discharge that is white with blood tinge that sometimes happen after her period. She has not been sexually active in 10 months. She denies any new new detergents, soaps, or clothing. She denies any fevers, chills, nausea or vomiting. She has not tried anything OTC for this.  Review of Systems      Past Medical History:  Diagnosis Date  . Migraine headache without aura 05/11/2015    Current Outpatient Medications  Medication Sig Dispense Refill  . atorvastatin (LIPITOR) 10 MG tablet Take 1 tablet (10 mg total) by mouth daily. 90 tablet 3  . metFORMIN (GLUCOPHAGE-XR) 500 MG 24 hr tablet Take 1 tablet (500 mg total) by mouth daily with breakfast. 90 tablet 1  . propranolol ER (INDERAL LA) 80 MG 24 hr capsule TAKE 1 CAPSULE BY MOUTH EVERY DAY (NEED APPT) 90 capsule 1  . sertraline (ZOLOFT) 25 MG tablet USE 1 WEEK PRIOR TO MENSES AND STOP 2 DAYS INTO MENSES 90 tablet 1  . Vitamin D, Ergocalciferol, (DRISDOL) 1.25 MG (50000 UT) CAPS capsule TAKE 1 CAPSULE (50,000 UNITS TOTAL) BY MOUTH EVERY 7 (SEVEN) DAYS. 12 capsule 0   No current facility-administered medications for this visit.     No Known Allergies  Family History  Problem Relation Age of Onset  . Hyperlipidemia Mother   . Heart disease Mother        cardiac Cath showed 40 % blockage  . Hyperlipidemia Father     Social History   Socioeconomic History  . Marital status: Married    Spouse name: Not on file  . Number of children: Not on file  . Years of education: Not on file  . Highest education level: Not on file  Occupational History  . Not on file  Social Needs  . Financial resource strain: Not on file   . Food insecurity    Worry: Not on file    Inability: Not on file  . Transportation needs    Medical: Not on file    Non-medical: Not on file  Tobacco Use  . Smoking status: Never Smoker  . Smokeless tobacco: Never Used  Substance and Sexual Activity  . Alcohol use: No    Alcohol/week: 0.0 standard drinks  . Drug use: No  . Sexual activity: Not on file  Lifestyle  . Physical activity    Days per week: Not on file    Minutes per session: Not on file  . Stress: Not on file  Relationships  . Social Herbalist on phone: Not on file    Gets together: Not on file    Attends religious service: Not on file    Active member of club or organization: Not on file    Attends meetings of clubs or organizations: Not on file    Relationship status: Not on file  . Intimate partner violence    Fear of current or ex partner: Not on file    Emotionally abused: Not on file    Physically abused: Not on file    Forced sexual activity: Not on file  Other Topics Concern  .  Not on file  Social History Narrative  . Not on file     Constitutional: Denies fever, malaise, fatigue, headache or abrupt weight changes.  Respiratory: Denies difficulty breathing, shortness of breath, cough or sputum production.   Cardiovascular: Denies chest pain, chest tightness, palpitations or swelling in the hands or feet.  Gastrointestinal: Denies abdominal pain, bloating, constipation, diarrhea or blood in the stool.  GU: Reports light blood tinged discharge and burning sensation. Denies urgency, frequency or pain with urination. Skin: Reports external vaginal itching. Denies  lesions or ulcercations.   No other specific complaints in a complete review of systems (except as listed in HPI above).  Objective:   Physical Exam   BP 102/68   Pulse 84   Temp 98 F (36.7 C) (Temporal)   Wt 194 lb (88 kg)   LMP 06/22/2019   SpO2 98%   BMI 30.61 kg/m   Wt Readings from Last 3 Encounters:  03/26/19  198 lb 4 oz (89.9 kg)  12/18/18 198 lb (89.8 kg)  09/28/18 199 lb 8 oz (90.5 kg)    General: Appears their stated age, obese, in NAD. Cardiovascular: Normal rate and rhythm. S1,S2 noted.  No murmur, rubs or gallops noted.  Pulmonary/Chest: Normal effort and positive vesicular breath sounds. No respiratory distress. No wheezes, rales or ronchi noted.  Abdomen: Soft and nontender. Normal bowel sounds. No distention or masses noted.  Pelvic: Self swabbed.  BMET    Component Value Date/Time   NA 137 03/19/2019 0951   K 4.0 03/19/2019 0951   CL 104 03/19/2019 0951   CO2 27 03/19/2019 0951   GLUCOSE 134 (H) 03/19/2019 0951   BUN 14 03/19/2019 0951   CREATININE 0.69 03/19/2019 0951   CALCIUM 9.3 03/19/2019 0951   GFRNONAA 141.01 12/29/2009 1037    Lipid Panel     Component Value Date/Time   CHOL 147 03/19/2019 0951   TRIG 106.0 03/19/2019 0951   HDL 42.10 03/19/2019 0951   CHOLHDL 3 03/19/2019 0951   VLDL 21.2 03/19/2019 0951   LDLCALC 84 03/19/2019 0951    CBC    Component Value Date/Time   WBC 8.6 02/04/2017 1706   RBC 4.39 02/04/2017 1706   HGB 13.5 02/04/2017 1706   HCT 39.9 02/04/2017 1706   PLT 360.0 02/04/2017 1706   MCV 91.0 02/04/2017 1706   MCHC 33.8 02/04/2017 1706   RDW 13.0 02/04/2017 1706   LYMPHSABS 2.1 02/04/2017 1706   MONOABS 0.6 02/04/2017 1706   EOSABS 0.4 02/04/2017 1706   BASOSABS 0.1 02/04/2017 1706    Hgb A1C Lab Results  Component Value Date   HGBA1C 6.5 03/19/2019          Assessment & Plan:  Vaginal Discharge and Irritation:  Self swab wet prep to check for yeast, BV RX for  Traimcinolone 0.5% cream BID prn   Will follow up after wet prep results are back Nicki Reaper, NP

## 2019-07-01 NOTE — Patient Instructions (Signed)
Vaginitis  Vaginitis is irritation and swelling (inflammation) of the vagina. It happens when normal bacteria and yeast in the vagina grow too much. There are many types of this condition. Treatment will depend on the type you have. Follow these instructions at home: Lifestyle  Keep your vagina area clean and dry. ? Avoid using soap. ? Rinse the area with water.  Do not do the following until your doctor says it is okay: ? Wash and clean out the vagina (douche). ? Use tampons. ? Have sex.  Wipe from front to back after going to the bathroom.  Let air reach your vagina. ? Wear cotton underwear. ? Do not wear: ? Underwear while you sleep. ? Tight pants. ? Thong underwear. ? Underwear or nylons without a cotton panel. ? Take off any wet clothing, such as bathing suits, as soon as possible.  Use gentle, non-scented products. Do not use things that can irritate the vagina, such as fabric softeners. Avoid the following products if they are scented: ? Feminine sprays. ? Detergents. ? Tampons. ? Feminine hygiene products. ? Soaps or bubble baths.  Practice safe sex and use condoms. General instructions  Take over-the-counter and prescription medicines only as told by your doctor.  If you were prescribed an antibiotic medicine, take or use it as told by your doctor. Do not stop taking or using the antibiotic even if you start to feel better.  Keep all follow-up visits as told by your doctor. This is important. Contact a doctor if:  You have pain in your belly.  You have a fever.  Your symptoms last for more than 2-3 days. Get help right away if:  You have a fever and your symptoms get worse all of a sudden. Summary  Vaginitis is irritation and swelling of the vagina. It can happen when the normal bacteria and yeast in the vagina grow too much. There are many types.  Treatment will depend on the type you have.  Do not douche, use tampons , or have sex until your health  care provider approves. When you can return to sex, practice safe sex and use condoms. This information is not intended to replace advice given to you by your health care provider. Make sure you discuss any questions you have with your health care provider. Document Released: 11/08/2008 Document Revised: 07/25/2017 Document Reviewed: 09/03/2016 Elsevier Patient Education  2020 [employer]  

## 2019-07-02 LAB — WET PREP BY MOLECULAR PROBE
Candida species: NOT DETECTED
MICRO NUMBER:: 1068823
SPECIMEN QUALITY:: ADEQUATE
Trichomonas vaginosis: NOT DETECTED

## 2019-07-02 MED ORDER — METRONIDAZOLE 0.75 % VA GEL: 1.0000 | Freq: Two times a day (BID) | VAGINAL | 0 refills | Status: DC

## 2019-07-02 NOTE — Addendum Note (Signed)
Addended by: Jearld Fenton on: 07/02/2019 03:30 PM   Modules accepted: Orders

## 2019-09-27 ENCOUNTER — Other Ambulatory Visit (INDEPENDENT_AMBULATORY_CARE_PROVIDER_SITE_OTHER): Payer: BC Managed Care – PPO

## 2019-09-27 ENCOUNTER — Telehealth: Payer: Self-pay | Admitting: Family Medicine

## 2019-09-27 ENCOUNTER — Other Ambulatory Visit: Payer: Self-pay

## 2019-09-27 DIAGNOSIS — E559 Vitamin D deficiency, unspecified: Secondary | ICD-10-CM

## 2019-09-27 DIAGNOSIS — E119 Type 2 diabetes mellitus without complications: Secondary | ICD-10-CM | POA: Diagnosis not present

## 2019-09-27 LAB — COMPREHENSIVE METABOLIC PANEL
ALT: 11 U/L (ref 0–35)
AST: 11 U/L (ref 0–37)
Albumin: 4.2 g/dL (ref 3.5–5.2)
Alkaline Phosphatase: 67 U/L (ref 39–117)
BUN: 10 mg/dL (ref 6–23)
CO2: 26 mEq/L (ref 19–32)
Calcium: 9.2 mg/dL (ref 8.4–10.5)
Chloride: 103 mEq/L (ref 96–112)
Creatinine, Ser: 0.73 mg/dL (ref 0.40–1.20)
GFR: 88.47 mL/min (ref >60.00)
Glucose, Bld: 140 mg/dL — ABNORMAL HIGH (ref 70–99)
Potassium: 4.3 mEq/L (ref 3.5–5.1)
Sodium: 137 mEq/L (ref 135–145)
Total Bilirubin: 0.5 mg/dL (ref 0.2–1.2)
Total Protein: 6.9 g/dL (ref 6.0–8.3)

## 2019-09-27 LAB — LIPID PANEL
Cholesterol: 148 mg/dL (ref 0–200)
HDL: 45.5 mg/dL (ref >39.00)
LDL Cholesterol: 77 mg/dL (ref 0–99)
NonHDL: 102.95
Total CHOL/HDL Ratio: 3
Triglycerides: 129 mg/dL (ref 0.0–149.0)
VLDL: 25.8 mg/dL (ref 0.0–40.0)

## 2019-09-27 LAB — VITAMIN D 25 HYDROXY (VIT D DEFICIENCY, FRACTURES): VITD: 19.91 ng/mL — ABNORMAL LOW (ref 30.00–100.00)

## 2019-09-27 LAB — HEMOGLOBIN A1C: Hgb A1c MFr Bld: 7 % — ABNORMAL HIGH (ref 4.6–6.5)

## 2019-09-27 NOTE — Telephone Encounter (Signed)
-----   Message from Alvina Chou sent at 09/24/2019  9:33 AM EST ----- Regarding: Lab orders for Monday, 2.1.21 Patient is scheduled for CPX labs, please order future labs, Thanks , Camelia Eng

## 2019-09-28 ENCOUNTER — Other Ambulatory Visit: Payer: BC Managed Care – PPO

## 2019-09-28 NOTE — Progress Notes (Signed)
No critical labs need to be addressed urgently. We will discuss labs in detail at upcoming office visit.   

## 2019-10-01 ENCOUNTER — Other Ambulatory Visit (HOSPITAL_COMMUNITY)
Admission: RE | Admit: 2019-10-01 | Discharge: 2019-10-01 | Disposition: A | Discharge status: Home / Self Care | Payer: BC Managed Care – PPO | Source: Ambulatory Visit | Attending: Family Medicine | Admitting: Family Medicine

## 2019-10-01 ENCOUNTER — Encounter: Payer: Self-pay | Admitting: Family Medicine

## 2019-10-01 ENCOUNTER — Other Ambulatory Visit: Payer: Self-pay

## 2019-10-01 ENCOUNTER — Ambulatory Visit (INDEPENDENT_AMBULATORY_CARE_PROVIDER_SITE_OTHER): Payer: BC Managed Care – PPO | Admitting: Family Medicine

## 2019-10-01 VITALS — BP 106/64 | HR 76 | Temp 97.0°F | Ht 67.0 in | Wt 200.0 lb

## 2019-10-01 DIAGNOSIS — E785 Hyperlipidemia, unspecified: Secondary | ICD-10-CM

## 2019-10-01 DIAGNOSIS — E119 Type 2 diabetes mellitus without complications: Secondary | ICD-10-CM

## 2019-10-01 DIAGNOSIS — E1169 Type 2 diabetes mellitus with other specified complication: Secondary | ICD-10-CM | POA: Diagnosis not present

## 2019-10-01 DIAGNOSIS — Z124 Encounter for screening for malignant neoplasm of cervix: Secondary | ICD-10-CM

## 2019-10-01 DIAGNOSIS — Z0000 Encounter for general adult medical examination without abnormal findings: Secondary | ICD-10-CM | POA: Diagnosis not present

## 2019-10-01 DIAGNOSIS — F3281 Premenstrual dysphoric disorder: Secondary | ICD-10-CM | POA: Diagnosis not present

## 2019-10-01 DIAGNOSIS — Z6831 Body mass index (BMI) 31.0-31.9, adult: Secondary | ICD-10-CM

## 2019-10-01 DIAGNOSIS — E6609 Other obesity due to excess calories: Secondary | ICD-10-CM

## 2019-10-01 LAB — MICROALBUMIN / CREATININE URINE RATIO
Creatinine,U: 136.9 mg/dL
Microalb Creat Ratio: 1.2 mg/g (ref 0.0–30.0)
Microalb, Ur: 1.6 mg/dL (ref 0.0–1.9)

## 2019-10-01 MED ORDER — VITAMIN D (ERGOCALCIFEROL) 1.25 MG (50000 UNIT) PO CAPS: 50000.0000 [IU] | ORAL_CAPSULE | ORAL | 1 refills | Status: DC

## 2019-10-01 MED ORDER — TRULICITY 0.75 MG/0.5ML ~~LOC~~ SOAJ: 0.7500 mg | SUBCUTANEOUS | 1 refills | Status: DC

## 2019-10-01 MED ORDER — METFORMIN HCL ER 500 MG PO TB24: 500.0000 mg | ORAL_TABLET | Freq: Every day | ORAL | 3 refills | Status: DC

## 2019-10-01 NOTE — Addendum Note (Signed)
Addended by: Roena Malady on: 10/01/2019 02:19 PM   Modules accepted: Orders

## 2019-10-01 NOTE — Addendum Note (Signed)
Addended by: Alvina Chou on: 10/01/2019 11:18 AM   Modules accepted: Orders

## 2019-10-01 NOTE — Assessment & Plan Note (Signed)
Improved control on SSRI.

## 2019-10-01 NOTE — Assessment & Plan Note (Signed)
Discussed lifestyle changes and med options to treat.

## 2019-10-01 NOTE — Assessment & Plan Note (Addendum)
Well controlled. Continue current medication. Will add trulicity for weight loss and cardiac prevention.  Encouraged exercise, weight loss, healthy eating habits.

## 2019-10-01 NOTE — Progress Notes (Signed)
Chief Complaint  Patient presents with  . Annual Exam    last Pap--2018--GYN    History of Present Illness: HPI The patient is here for annual wellness exam and preventative care.   Feeling overall.  Elevated Cholesterol:  LDL at goal on statin. Lab Results  Component Value Date   CHOL 148 09/27/2019   HDL 45.50 09/27/2019   LDLCALC 77 09/27/2019   LDLDIRECT 146.0 02/04/2017   TRIG 129.0 09/27/2019   CHOLHDL 3 09/27/2019  Using medications without problems:none Muscle aches: none Diet compliance: moderate Exercise: occ Other complaints:  Diabetes:   Tolerable control on metformin, but trending higher after holidays, has gained weight. Lab Results  Component Value Date   HGBA1C 7.0 (H) 09/27/2019  Using medications without difficulties: Hypoglycemic episodes: Hyperglycemic episodes: Feet problems: no ulcers Blood Sugars averaging: not checking. eye exam within last year: DUE  Wt Readings from Last 3 Encounters:  10/01/19 200 lb (90.7 kg)  07/01/19 194 lb (88 kg)  03/26/19 198 lb 4 oz (89.9 kg)    PMDD  Well controlled on sertraline low dose off and on prior to menses. Depression screen [facility] 2/9 10/01/2019 06/12/2018 02/04/2017  Decreased Interest 0 0 0  Down, Depressed, Hopeless 0 2 0  PHQ - 2 Score 0 2 0  Altered sleeping 0 1 -  Tired, decreased energy 0 1 -  Change in appetite 0 1 -  Feeling bad or failure about yourself  0 1 -  Trouble concentrating 0 0 -  Moving slowly or fidgety/restless 0 0 -  Suicidal thoughts 0 0 -  PHQ-9 Score 0 6 -  Difficult doing work/chores Not difficult at all Not difficult at all -   Vit D very low: restart supplementation   This visit occurred during the SARS-CoV-2 public health emergency.  Safety protocols were in place, including screening questions prior to the visit, additional usage of staff PPE, and extensive cleaning of exam room while observing appropriate contact time as indicated for disinfecting solutions.   COVID  19 screen:  No recent travel or known exposure to COVID19 The patient denies respiratory symptoms of COVID 19 at this time. The importance of social distancing was discussed today.     Review of Systems  Constitutional: Negative for chills and fever.  HENT: Negative for congestion and ear pain.   Eyes: Negative for pain and redness.  Respiratory: Negative for cough and shortness of breath.   Cardiovascular: Negative for chest pain, palpitations and leg swelling.  Gastrointestinal: Negative for abdominal pain, blood in stool, constipation, diarrhea, nausea and vomiting.  Genitourinary: Negative for dysuria.  Musculoskeletal: Negative for falls and myalgias.  Skin: Negative for rash.  Neurological: Negative for dizziness.  Psychiatric/Behavioral: Negative for depression. The patient is not nervous/anxious.       Past Medical History:  Diagnosis Date  . Migraine headache without aura 05/11/2015    reports that she has never smoked. She has never used smokeless tobacco. She reports that she does not drink alcohol or use drugs.   Current Outpatient Medications:  .  atorvastatin (LIPITOR) 10 MG tablet, Take 1 tablet (10 mg total) by mouth daily., Disp: 90 tablet, Rfl: 3 .  metFORMIN (GLUCOPHAGE-XR) 500 MG 24 hr tablet, Take 1 tablet (500 mg total) by mouth daily with breakfast., Disp: 90 tablet, Rfl: 1 .  Multiple Vitamin (MULTIVITAMIN) tablet, Take 1 tablet by mouth daily., Disp: , Rfl:  .  propranolol ER (INDERAL LA) 80 MG 24 hr capsule, TAKE  1 CAPSULE BY MOUTH EVERY DAY (NEED APPT), Disp: 90 capsule, Rfl: 1 .  sertraline (ZOLOFT) 25 MG tablet, USE 1 WEEK PRIOR TO MENSES AND STOP 2 DAYS INTO MENSES, Disp: 90 tablet, Rfl: 1   Observations/Objective: Blood pressure 106/64, pulse 76, temperature (!) 97 F (36.1 C), height 5\' 7"  (1.702 m), weight 200 lb (90.7 kg), last menstrual period 09/11/2019, SpO2 98 %, unknown if currently breastfeeding.  Physical Exam Constitutional:       General: She is not in acute distress.    Appearance: Normal appearance. She is well-developed. She is not ill-appearing or toxic-appearing.  HENT:     Head: Normocephalic.     Right Ear: Hearing, tympanic membrane, ear canal and external ear normal.     Left Ear: Hearing, tympanic membrane, ear canal and external ear normal.     Nose: Nose normal.  Eyes:     General: Lids are normal. Lids are everted, no foreign bodies appreciated.     Conjunctiva/sclera: Conjunctivae normal.     Pupils: Pupils are equal, round, and reactive to light.  Neck:     Thyroid: No thyroid mass or thyromegaly.     Vascular: No carotid bruit.     Trachea: Trachea normal.  Cardiovascular:     Rate and Rhythm: Normal rate and regular rhythm.     Heart sounds: Normal heart sounds, S1 normal and S2 normal. No murmur. No gallop.   Pulmonary:     Effort: Pulmonary effort is normal. No respiratory distress.     Breath sounds: Normal breath sounds. No wheezing, rhonchi or rales.  Abdominal:     General: Bowel sounds are normal. There is no distension or abdominal bruit.     Palpations: Abdomen is soft. There is no fluid wave or mass.     Tenderness: There is no abdominal tenderness. There is no guarding or rebound.     Hernia: No hernia is present.  Genitourinary:    Exam position: Supine.     Labia:        Right: No rash, tenderness or lesion.        Left: No rash, tenderness or lesion.      Vagina: Normal.     Cervix: No cervical motion tenderness, discharge or friability.     Uterus: Not enlarged and not tender.      Adnexa:        Right: No mass, tenderness or fullness.         Left: No mass, tenderness or fullness.    Musculoskeletal:     Cervical back: Normal range of motion and neck supple.  Lymphadenopathy:     Cervical: No cervical adenopathy.  Skin:    General: Skin is warm and dry.     Findings: No rash.  Neurological:     Mental Status: She is alert.     Cranial Nerves: No cranial nerve  deficit.     Sensory: No sensory deficit.  Psychiatric:        Mood and Affect: Mood is not anxious or depressed.        Speech: Speech normal.        Behavior: Behavior normal. Behavior is cooperative.        Judgment: Judgment normal.      Diabetic foot exam: Normal inspection No skin breakdown No calluses  Normal DP pulses Normal sensation to light touch and monofilament Nails normal  Assessment and Plan The patient's preventative maintenance and recommended screening tests for  an annual wellness exam were reviewed in full today. Brought up to date unless services declined.  Counselled on the importance of diet, exercise, and its role in overall health and mortality. The patient's FH and SH was reviewed, including their home life, tobacco status, and drug and alcohol status.   PAP/DVE: per GYN. not due for mammograms: sister with Breast cancer age 40.. Start mammo at 49.  Last PAP 2018...  Not clear if HPV done.. will repeat today. Vaccines:uptodate , flu at work, started [employer] series, Nonsmoker STD screen: refused. No early family history of colon cancer.  Type 2 diabetes mellitus without complication, without long-term current use of insulin (HCC) Well controlled. Continue current medication. Will add trulicity for weight loss and cardiac prevention.  Encouraged exercise, weight loss, healthy eating habits.   Hyperlipidemia associated with type 2 diabetes mellitus ([place]) At goal on statin.  Obesity with serious comorbidity  Discussed lifestyle changes and med options to treat.  PMDD (premenstrual dysphoric disorder) Improved control on SSRI.       Eliezer Lofts, MD

## 2019-10-01 NOTE — Patient Instructions (Addendum)
Set up yearly eye exam.   Stop at lab on way out for urine microalbumin.  Call to set up mammogram on your own after you turn 39.  Try to increase back physical activity.  Start Trulicity weekly... call with blood sugar and weight update in 2-4 weeks. Start back on  High dose vit D supplement

## 2019-10-01 NOTE — Assessment & Plan Note (Signed)
At goal on statin 

## 2019-10-11 LAB — CYTOLOGY - PAP
Comment: NEGATIVE
Comment: NEGATIVE
Diagnosis: NEGATIVE
HPV 16: NEGATIVE
HPV 18 / 45: NEGATIVE
High risk HPV: POSITIVE — AB

## 2019-11-26 ENCOUNTER — Other Ambulatory Visit: Payer: Self-pay | Admitting: Family Medicine

## 2019-11-30 ENCOUNTER — Other Ambulatory Visit: Payer: Self-pay | Admitting: Family Medicine

## 2019-11-30 DIAGNOSIS — E119 Type 2 diabetes mellitus without complications: Secondary | ICD-10-CM

## 2019-11-30 MED ORDER — TRULICITY 1.5 MG/0.5ML ~~LOC~~ SOAJ: 1.5000 mg | SUBCUTANEOUS | 11 refills | Status: DC

## 2019-12-13 ENCOUNTER — Ambulatory Visit: Payer: BC Managed Care – PPO | Admitting: Family Medicine

## 2019-12-13 ENCOUNTER — Other Ambulatory Visit: Payer: Self-pay

## 2019-12-13 ENCOUNTER — Encounter: Payer: Self-pay | Admitting: Family Medicine

## 2019-12-13 DIAGNOSIS — H00019 Hordeolum externum unspecified eye, unspecified eyelid: Secondary | ICD-10-CM

## 2019-12-13 MED ORDER — POLYMYXIN B-TRIMETHOPRIM 10000-0.1 UNIT/ML-% OP SOLN: 1.0000 [drp] | OPHTHALMIC | 0 refills | Status: DC

## 2019-12-13 NOTE — Patient Instructions (Signed)
Keep using warm compresses.  You will likely not need antibiotic drops.  If you are not better in a few days, then start the drops and call the eye clinic about follow up.  Take care.  Glad to see you.

## 2019-12-13 NOTE — Progress Notes (Signed)
This visit occurred during the SARS-CoV-2 public health emergency.  Safety protocols were in place, including screening questions prior to the visit, additional usage of staff PPE, and extensive cleaning of exam room while observing appropriate contact time as indicated for disinfecting solutions.  She isn't lightheaded.  BP at baseline.    R upper eyelid.  Medial.  Had been doing warm compresses.  It got a little better but didn't resolve.  Present for about 2 weeks.  H/o one in the distant past, similar, that had resolved.  No trauma, no FB.  No vision changes.  No discharge.  No double vision, no blurriness.  She had lasik, doesn't wear contacts. No FCNAV.  Sugar has been controlled, usually <100 in the AMs.    Meds, vitals, and allergies reviewed.   ROS: Per HPI unless specifically indicated in ROS section   nad ncat PERRL EOMI R upper medial stye but otherwise eyelids normal x4.  With neg limited fundus exam B.  Conjunctiva normal bilaterally.  No discharge.

## 2019-12-15 DIAGNOSIS — H00019 Hordeolum externum unspecified eye, unspecified eyelid: Secondary | ICD-10-CM | POA: Insufficient documentation

## 2019-12-15 NOTE — Assessment & Plan Note (Signed)
Stye noted but otherwise benign exam.  Discussed options. Keep using warm compresses.  She will likely not need antibiotic drops.  If not better in a few days with persistent application of warm compresses, then start antibiotic drops and she will call the eye clinic about follow up.

## 2019-12-24 NOTE — Telephone Encounter (Signed)
-----   Message from Alvina Chou sent at 12/14/2019 11:55 AM EDT ----- Regarding: Lab orders for Monday, 5.3.21 LAB ORDERS FOR F/U LABS

## 2019-12-26 ENCOUNTER — Other Ambulatory Visit: Payer: Self-pay | Admitting: Family Medicine

## 2019-12-27 ENCOUNTER — Other Ambulatory Visit: Payer: Self-pay

## 2019-12-27 ENCOUNTER — Other Ambulatory Visit (INDEPENDENT_AMBULATORY_CARE_PROVIDER_SITE_OTHER): Payer: BC Managed Care – PPO

## 2019-12-27 DIAGNOSIS — E119 Type 2 diabetes mellitus without complications: Secondary | ICD-10-CM | POA: Diagnosis not present

## 2019-12-27 LAB — HEMOGLOBIN A1C: Hgb A1c MFr Bld: 5.8 % (ref 4.6–6.5)

## 2019-12-28 ENCOUNTER — Encounter: Payer: Self-pay | Admitting: Family Medicine

## 2019-12-28 ENCOUNTER — Ambulatory Visit: Payer: BC Managed Care – PPO | Admitting: Family Medicine

## 2019-12-28 ENCOUNTER — Other Ambulatory Visit: Payer: Self-pay

## 2019-12-28 DIAGNOSIS — E119 Type 2 diabetes mellitus without complications: Secondary | ICD-10-CM | POA: Diagnosis not present

## 2019-12-28 NOTE — Progress Notes (Signed)
Chief Complaint  Patient presents with  . Diabetes    History of Present Illness: HPI  39 year old female presents for follow up DM  Diabetes:  At last OV added Trulicity (increased to 1.5 mg weekly on 11/30/2019)  she has lost 14 lbs in last 3 months! She stopped metofomin 2 weeks ago given lower CBGS. Lab Results  Component Value Date   HGBA1C 5.8 12/27/2019  Using medications without difficulties: Hypoglycemic episodes:  occ Hyperglycemic episodes:none Feet problems: no ulcers Blood Sugars averaging: FBS 83-106 eye exam within last year:  exercisie: 3 times a week Diet: healthy eating Wt Readings from Last 3 Encounters:  12/28/19 186 lb 4 oz (84.5 kg)  12/13/19 190 lb 7 oz (86.4 kg)  10/01/19 200 lb (90.7 kg)     This visit occurred during the SARS-CoV-2 public health emergency.  Safety protocols were in place, including screening questions prior to the visit, additional usage of staff PPE, and extensive cleaning of exam room while observing appropriate contact time as indicated for disinfecting solutions.   COVID 19 screen:  No recent travel or known exposure to COVID19 The patient denies respiratory symptoms of COVID 19 at this time. The importance of social distancing was discussed today.     Review of Systems  Constitutional: Negative for chills and fever.  HENT: Negative for congestion and ear pain.   Eyes: Negative for pain and redness.  Respiratory: Negative for cough and shortness of breath.   Cardiovascular: Negative for chest pain, palpitations and leg swelling.  Gastrointestinal: Negative for abdominal pain, blood in stool, constipation, diarrhea, nausea and vomiting.  Genitourinary: Negative for dysuria.  Musculoskeletal: Negative for falls and myalgias.  Skin: Negative for rash.  Neurological: Negative for dizziness.  Psychiatric/Behavioral: Negative for depression. The patient is not nervous/anxious.       Past Medical History:  Diagnosis Date  .  Migraine headache without aura 05/11/2015    reports that she has never smoked. She has never used smokeless tobacco. She reports that she does not drink alcohol or use drugs.   Current Outpatient Medications:  .  atorvastatin (LIPITOR) 10 MG tablet, TAKE 1 TABLET BY MOUTH EVERY DAY, Disp: 90 tablet, Rfl: 3 .  Dulaglutide (TRULICITY) 1.5 OE/7.0JJ SOPN, Inject 1.5 mg into the skin once a week., Disp: 3 mL, Rfl: 11 .  metFORMIN (GLUCOPHAGE-XR) 500 MG 24 hr tablet, Take 1 tablet (500 mg total) by mouth daily with breakfast., Disp: 90 tablet, Rfl: 3 .  Multiple Vitamin (MULTIVITAMIN) tablet, Take 1 tablet by mouth daily., Disp: , Rfl:  .  propranolol ER (INDERAL LA) 80 MG 24 hr capsule, TAKE 1 CAPSULE BY MOUTH EVERY DAY (NEED APPT), Disp: 90 capsule, Rfl: 1 .  sertraline (ZOLOFT) 25 MG tablet, USE 1 WEEK PRIOR TO MENSES AND STOP 2 DAYS INTO MENSES, Disp: 90 tablet, Rfl: 1 .  Vitamin D, Ergocalciferol, (DRISDOL) 1.25 MG (50000 UNIT) CAPS capsule, Take 1 capsule (50,000 Units total) by mouth every 7 (seven) days., Disp: 12 capsule, Rfl: 1   Observations/Objective: Blood pressure 94/72, pulse 77, temperature 97.9 F (36.6 C), temperature source Temporal, height 5\' 7"  (1.702 m), weight 186 lb 4 oz (84.5 kg), SpO2 97 %, unknown if currently breastfeeding.  Physical Exam Constitutional:      General: She is not in acute distress.    Appearance: Normal appearance. She is well-developed. She is not ill-appearing or toxic-appearing.  HENT:     Head: Normocephalic.     Right  Ear: Hearing, tympanic membrane, ear canal and external ear normal. Tympanic membrane is not erythematous, retracted or bulging.     Left Ear: Hearing, tympanic membrane, ear canal and external ear normal. Tympanic membrane is not erythematous, retracted or bulging.     Nose: No mucosal edema or rhinorrhea.     Right Sinus: No maxillary sinus tenderness or frontal sinus tenderness.     Left Sinus: No maxillary sinus tenderness or  frontal sinus tenderness.     Mouth/Throat:     Pharynx: Uvula midline.  Eyes:     General: Lids are normal. Lids are everted, no foreign bodies appreciated.     Conjunctiva/sclera: Conjunctivae normal.     Pupils: Pupils are equal, round, and reactive to light.  Neck:     Thyroid: No thyroid mass or thyromegaly.     Vascular: No carotid bruit.     Trachea: Trachea normal.  Cardiovascular:     Rate and Rhythm: Normal rate and regular rhythm.     Pulses: Normal pulses.     Heart sounds: Normal heart sounds, S1 normal and S2 normal. No murmur. No friction rub. No gallop.   Pulmonary:     Effort: Pulmonary effort is normal. No tachypnea or respiratory distress.     Breath sounds: Normal breath sounds. No decreased breath sounds, wheezing, rhonchi or rales.  Abdominal:     General: Bowel sounds are normal.     Palpations: Abdomen is soft.     Tenderness: There is no abdominal tenderness.  Musculoskeletal:     Cervical back: Normal range of motion and neck supple.  Skin:    General: Skin is warm and dry.     Findings: No rash.  Neurological:     Mental Status: She is alert.  Psychiatric:        Mood and Affect: Mood is not anxious or depressed.        Speech: Speech normal.        Behavior: Behavior normal. Behavior is cooperative.        Thought Content: Thought content normal.        Judgment: Judgment normal.      Assessment and Plan   Type 2 diabetes mellitus without complication, without long-term current use of insulin (HCC) Excellent improvement in A1C as well as with weight loss.  Encouraged exercise, weight loss, healthy eating habits.      Kerby Nora, MD

## 2019-12-28 NOTE — Progress Notes (Signed)
No critical labs need to be addressed urgently. We will discuss labs in detail at upcoming office visit.   

## 2019-12-28 NOTE — Patient Instructions (Signed)
Keep up the great work!

## 2019-12-28 NOTE — Assessment & Plan Note (Signed)
Excellent improvement in A1C as well as with weight loss.  Encouraged exercise, weight loss, healthy eating habits.

## 2019-12-31 ENCOUNTER — Ambulatory Visit: Payer: BC Managed Care – PPO | Admitting: Family Medicine

## 2020-03-21 ENCOUNTER — Telehealth: Payer: Self-pay

## 2020-03-21 ENCOUNTER — Ambulatory Visit: Payer: BC Managed Care – PPO | Admitting: Family Medicine

## 2020-03-21 NOTE — Telephone Encounter (Signed)
Per appt notes pt already has appt today at 2:40 with Dr Ermalene Searing.

## 2020-03-21 NOTE — Telephone Encounter (Signed)
[facility] Night - Client Nonclinical Telephone Record AccessNurse Client [name] Night - Client Client Site [facility] - Night Physician Kerby Nora - MD Contact Type Call Who Is Calling Patient / Member / Family / Caregiver Caller Name [name] Caller Phone Number [phone] Patient Name Erica Singh Patient DOB [birth year] Call Type Message Only Information Provided Reason for Call Request to Schedule Office Appointment Initial Comment Caller states she is calling for an appointment. Additional Comment Office hours provided. Caller denied triage. Disp. Time Disposition Final User 03/20/2020 5:03:31 PM General Information Provided Yes Emeline Gins Call Closed By: Emeline Gins Transaction Date/Time: 03/20/2020 5:01:45 PM (ET)

## 2020-03-22 ENCOUNTER — Ambulatory Visit: Payer: BC Managed Care – PPO | Admitting: Family Medicine

## 2020-03-22 ENCOUNTER — Other Ambulatory Visit: Payer: Self-pay

## 2020-03-22 ENCOUNTER — Encounter: Payer: Self-pay | Admitting: Family Medicine

## 2020-03-22 VITALS — BP 92/70 | HR 77 | Temp 98.4°F | Ht 67.0 in | Wt 181.2 lb

## 2020-03-22 DIAGNOSIS — N898 Other specified noninflammatory disorders of vagina: Secondary | ICD-10-CM | POA: Insufficient documentation

## 2020-03-22 LAB — POCT WET PREP (WET MOUNT): Trichomonas Wet Prep HPF POC: ABSENT

## 2020-03-22 MED ORDER — METRONIDAZOLE 500 MG PO TABS: 500.0000 mg | ORAL_TABLET | Freq: Two times a day (BID) | ORAL | 0 refills | Status: DC

## 2020-03-22 NOTE — Patient Instructions (Signed)
We will call with the other test results.  Complete course of antibiotics.  Bacterial Vaginosis  Bacterial vaginosis is a vaginal infection that occurs when the normal balance of bacteria in the vagina is disrupted. It results from an overgrowth of certain bacteria. This is the most common vaginal infection among women ages 68-44. Because bacterial vaginosis increases your risk for STIs (sexually transmitted infections), getting treated can help reduce your risk for chlamydia, gonorrhea, herpes, and HIV (human immunodeficiency virus). Treatment is also important for preventing complications in pregnant women, because this condition can cause an early (premature) delivery. What are the causes? This condition is caused by an increase in harmful bacteria that are normally present in small amounts in the vagina. However, the reason that the condition develops is not fully understood. What increases the risk? The following factors may make you more likely to develop this condition:  Having a new sexual partner or multiple sexual partners.  Having unprotected sex.  Douching.  Having an intrauterine device (IUD).  Smoking.  Drug and alcohol abuse.  Taking certain antibiotic medicines.  Being pregnant. You cannot get bacterial vaginosis from toilet seats, bedding, swimming pools, or contact with objects around you. What are the signs or symptoms? Symptoms of this condition include:  Grey or white vaginal discharge. The discharge can also be watery or foamy.  A fish-like odor with discharge, especially after sexual intercourse or during menstruation.  Itching in and around the vagina.  Burning or pain with urination. Some women with bacterial vaginosis have no signs or symptoms. How is this diagnosed? This condition is diagnosed based on:  Your medical history.  A physical exam of the vagina.  Testing a sample of vaginal fluid under a microscope to look for a large amount of bad  bacteria or abnormal cells. Your health care provider may use a cotton swab or a small wooden spatula to collect the sample. How is this treated? This condition is treated with antibiotics. These may be given as a pill, a vaginal cream, or a medicine that is put into the vagina (suppository). If the condition comes back after treatment, a second round of antibiotics may be needed. Follow these instructions at home: Medicines  Take over-the-counter and prescription medicines only as told by your health care provider.  Take or use your antibiotic as told by your health care provider. Do not stop taking or using the antibiotic even if you start to feel better. General instructions  If you have a female sexual partner, tell her that you have a vaginal infection. She should see her health care provider and be treated if she has symptoms. If you have a female sexual partner, he does not need treatment.  During treatment: ? Avoid sexual activity until you finish treatment. ? Do not douche. ? Avoid alcohol as directed by your health care provider. ? Avoid breastfeeding as directed by your health care provider.  Drink enough water and fluids to keep your urine clear or pale yellow.  Keep the area around your vagina and rectum clean. ? Wash the area daily with warm water. ? Wipe yourself from front to back after using the toilet.  Keep all follow-up visits as told by your health care provider. This is important. How is this prevented?  Do not douche.  Wash the outside of your vagina with warm water only.  Use protection when having sex. This includes latex condoms and dental dams.  Limit how many sexual partners you have. To  help prevent bacterial vaginosis, it is best to have sex with just one partner (monogamous).  Make sure you and your sexual partner are tested for STIs.  Wear cotton or cotton-lined underwear.  Avoid wearing tight pants and pantyhose, especially during  summer.  Limit the amount of alcohol that you drink.  Do not use any products that contain nicotine or tobacco, such as cigarettes and e-cigarettes. If you need help quitting, ask your health care provider.  Do not use illegal drugs. Where to find more information  Centers for Disease Control and Prevention: SolutionApps.co.za  American Sexual Health Association (ASHA): www.ashastd.org  U.S. Department of Health and Health and safety inspector, Office on Women's Health: ConventionalMedicines.si or http://www.anderson-williamson.info/ Contact a health care provider if:  Your symptoms do not improve, even after treatment.  You have more discharge or pain when urinating.  You have a fever.  You have pain in your abdomen.  You have pain during sex.  You have vaginal bleeding between periods. Summary  Bacterial vaginosis is a vaginal infection that occurs when the normal balance of bacteria in the vagina is disrupted.  Because bacterial vaginosis increases your risk for STIs (sexually transmitted infections), getting treated can help reduce your risk for chlamydia, gonorrhea, herpes, and HIV (human immunodeficiency virus). Treatment is also important for preventing complications in pregnant women, because the condition can cause an early (premature) delivery.  This condition is treated with antibiotic medicines. These may be given as a pill, a vaginal cream, or a medicine that is put into the vagina (suppository). This information is not intended to replace advice given to you by your health care provider. Make sure you discuss any questions you have with your health care provider. Document Revised: 07/25/2017 Document Reviewed: 04/27/2016 Elsevier Patient Education  2020 [employer].

## 2020-03-22 NOTE — Assessment & Plan Note (Signed)
Treat with oral flagyl x 7 days for BV. Send out GC/Chlam test.

## 2020-03-22 NOTE — Progress Notes (Signed)
Chief Complaint  Patient presents with  . Vaginal Discharge    History of Present Illness: HPI  39 year old female with history of BV present with change in vaginal discharge.   She has had sex with her ex husband recently.  Menses regular. Started having green discharge with odor 1 and 1/2 weeks ago. Now  Discharge has changed to white heavier discharge.   No vaginal itching.Mild central abd pain, mild low ba ck pain.  No dysuria, no hematuria.   No  STD known.  Has not treated with any med.    This visit occurred during the SARS-CoV-2 public health emergency.  Safety protocols were in place, including screening questions prior to the visit, additional usage of staff PPE, and extensive cleaning of exam room while observing appropriate contact time as indicated for disinfecting solutions.   COVID 19 screen:  No recent travel or known exposure to COVID19 The patient denies respiratory symptoms of COVID 19 at this time. The importance of social distancing was discussed today.     Review of Systems  Constitutional: Negative for chills and fever.  HENT: Negative for congestion and ear pain.   Eyes: Negative for pain and redness.  Respiratory: Negative for cough and shortness of breath.   Cardiovascular: Negative for chest pain, palpitations and leg swelling.  Gastrointestinal: Positive for abdominal pain. Negative for blood in stool, constipation, diarrhea, nausea and vomiting.  Genitourinary: Negative for dysuria.  Musculoskeletal: Negative for falls and myalgias.  Skin: Negative for rash.  Neurological: Negative for dizziness.  Psychiatric/Behavioral: Negative for depression. The patient is not nervous/anxious.       Past Medical History:  Diagnosis Date  . Migraine headache without aura 05/11/2015    reports that she has never smoked. She has never used smokeless tobacco. She reports that she does not drink alcohol and does not use drugs.   Current Outpatient  Medications:  .  atorvastatin (LIPITOR) 10 MG tablet, TAKE 1 TABLET BY MOUTH EVERY DAY, Disp: 90 tablet, Rfl: 3 .  Dulaglutide (TRULICITY) 1.5 MG/0.5ML SOPN, Inject 1.5 mg into the skin once a week., Disp: 3 mL, Rfl: 11 .  metFORMIN (GLUCOPHAGE-XR) 500 MG 24 hr tablet, Take 1 tablet (500 mg total) by mouth daily with breakfast., Disp: 90 tablet, Rfl: 3 .  Multiple Vitamin (MULTIVITAMIN) tablet, Take 1 tablet by mouth daily., Disp: , Rfl:  .  propranolol ER (INDERAL LA) 80 MG 24 hr capsule, TAKE 1 CAPSULE BY MOUTH EVERY DAY (NEED APPT), Disp: 90 capsule, Rfl: 1 .  sertraline (ZOLOFT) 25 MG tablet, USE 1 WEEK PRIOR TO MENSES AND STOP 2 DAYS INTO MENSES, Disp: 90 tablet, Rfl: 1 .  Vitamin D, Ergocalciferol, (DRISDOL) 1.25 MG (50000 UNIT) CAPS capsule, Take 1 capsule (50,000 Units total) by mouth every 7 (seven) days., Disp: 12 capsule, Rfl: 1   Observations/Objective: Blood pressure 92/70, pulse 77, temperature 98.4 F (36.9 C), temperature source Temporal, height 5\' 7"  (1.702 m), weight 181 lb 4 oz (82.2 kg), last menstrual period 03/07/2020, SpO2 98 %, unknown if currently breastfeeding.  Physical Exam Exam conducted with a chaperone present.  Constitutional:      General: She is not in acute distress.    Appearance: Normal appearance. She is well-developed. She is not ill-appearing or toxic-appearing.  HENT:     Head: Normocephalic.     Right Ear: Hearing, tympanic membrane, ear canal and external ear normal. Tympanic membrane is not erythematous, retracted or bulging.  Left Ear: Hearing, tympanic membrane, ear canal and external ear normal. Tympanic membrane is not erythematous, retracted or bulging.     Nose: No mucosal edema or rhinorrhea.     Right Sinus: No maxillary sinus tenderness or frontal sinus tenderness.     Left Sinus: No maxillary sinus tenderness or frontal sinus tenderness.     Mouth/Throat:     Pharynx: Uvula midline.  Eyes:     General: Lids are normal. Lids are  everted, no foreign bodies appreciated.     Conjunctiva/sclera: Conjunctivae normal.     Pupils: Pupils are equal, round, and reactive to light.  Neck:     Thyroid: No thyroid mass or thyromegaly.     Vascular: No carotid bruit.     Trachea: Trachea normal.  Cardiovascular:     Rate and Rhythm: Normal rate and regular rhythm.     Pulses: Normal pulses.     Heart sounds: Normal heart sounds, S1 normal and S2 normal. No murmur heard.  No friction rub. No gallop.   Pulmonary:     Effort: Pulmonary effort is normal. No tachypnea or respiratory distress.     Breath sounds: Normal breath sounds. No decreased breath sounds, wheezing, rhonchi or rales.  Abdominal:     General: Bowel sounds are normal.     Palpations: Abdomen is soft.     Tenderness: There is abdominal tenderness in the suprapubic area.  Genitourinary:    Vagina: Vaginal discharge present.     Cervix: Normal.     Adnexa: Right adnexa normal.       Left: Tenderness present.      Rectum: Normal.     Comments: Yellowish thin discharge in vaginal vault Musculoskeletal:     Cervical back: Normal range of motion and neck supple.  Skin:    General: Skin is warm and dry.     Findings: No rash.  Neurological:     Mental Status: She is alert.  Psychiatric:        Mood and Affect: Mood is not anxious or depressed.        Speech: Speech normal.        Behavior: Behavior normal. Behavior is cooperative.        Thought Content: Thought content normal.        Judgment: Judgment normal.      Assessment and Plan Vaginal discharge Treat with oral flagyl x 7 days for BV. Send out GC/Chlam test.       Kerby Nora, MD

## 2020-03-23 LAB — C. TRACHOMATIS/N. GONORRHOEAE RNA
C. trachomatis RNA, TMA: NOT DETECTED
N. gonorrhoeae RNA, TMA: NOT DETECTED

## 2020-05-29 ENCOUNTER — Telehealth: Payer: Self-pay | Admitting: Family Medicine

## 2020-05-29 ENCOUNTER — Other Ambulatory Visit (INDEPENDENT_AMBULATORY_CARE_PROVIDER_SITE_OTHER): Payer: BC Managed Care – PPO

## 2020-05-29 ENCOUNTER — Other Ambulatory Visit: Payer: Self-pay

## 2020-05-29 DIAGNOSIS — E559 Vitamin D deficiency, unspecified: Secondary | ICD-10-CM

## 2020-05-29 DIAGNOSIS — Z5320 Procedure and treatment not carried out because of patient's decision for unspecified reasons: Secondary | ICD-10-CM

## 2020-05-29 DIAGNOSIS — E119 Type 2 diabetes mellitus without complications: Secondary | ICD-10-CM | POA: Diagnosis not present

## 2020-05-29 LAB — LIPID PANEL
Cholesterol: 145 mg/dL (ref 0–200)
HDL: 39.5 mg/dL (ref >39.00)
LDL Cholesterol: 88 mg/dL (ref 0–99)
NonHDL: 105.13
Total CHOL/HDL Ratio: 4
Triglycerides: 84 mg/dL (ref 0.0–149.0)
VLDL: 16.8 mg/dL (ref 0.0–40.0)

## 2020-05-29 LAB — COMPREHENSIVE METABOLIC PANEL
ALT: 11 U/L (ref 0–35)
AST: 11 U/L (ref 0–37)
Albumin: 4 g/dL (ref 3.5–5.2)
Alkaline Phosphatase: 54 U/L (ref 39–117)
BUN: 10 mg/dL (ref 6–23)
CO2: 29 mEq/L (ref 19–32)
Calcium: 9.1 mg/dL (ref 8.4–10.5)
Chloride: 106 mEq/L (ref 96–112)
Creatinine, Ser: 0.73 mg/dL (ref 0.40–1.20)
GFR: 88.17 mL/min (ref >60.00)
Glucose, Bld: 106 mg/dL — ABNORMAL HIGH (ref 70–99)
Potassium: 4.2 mEq/L (ref 3.5–5.1)
Sodium: 141 mEq/L (ref 135–145)
Total Bilirubin: 0.4 mg/dL (ref 0.2–1.2)
Total Protein: 6.4 g/dL (ref 6.0–8.3)

## 2020-05-29 LAB — VITAMIN D 25 HYDROXY (VIT D DEFICIENCY, FRACTURES): VITD: 28.57 ng/mL — ABNORMAL LOW (ref 30.00–100.00)

## 2020-05-29 LAB — HEMOGLOBIN A1C: Hgb A1c MFr Bld: 6 % (ref 4.6–6.5)

## 2020-05-29 NOTE — Telephone Encounter (Signed)
-----   Message from Aquilla Solian, RT sent at 05/29/2020  9:11 AM EDT ----- Regarding: Lab Orders Needed for Today Monday 10.4.2021 Please place lab orders for today Monday 10.4.2021, 6 month f/u appt coming up Thank you, Jones Bales RT(R)

## 2020-05-30 ENCOUNTER — Other Ambulatory Visit: Payer: BC Managed Care – PPO

## 2020-05-30 LAB — HEPATITIS C ANTIBODY
Hepatitis C Ab: NONREACTIVE
SIGNAL TO CUT-OFF: 0.01 (ref <1.00)

## 2020-05-30 NOTE — Progress Notes (Signed)
No critical labs need to be addressed urgently. We will discuss labs in detail at upcoming office visit.   

## 2020-06-02 ENCOUNTER — Other Ambulatory Visit: Payer: Self-pay

## 2020-06-02 ENCOUNTER — Ambulatory Visit: Payer: BC Managed Care – PPO | Admitting: Family Medicine

## 2020-06-02 ENCOUNTER — Encounter: Payer: Self-pay | Admitting: Family Medicine

## 2020-06-02 VITALS — BP 94/62 | HR 83 | Temp 97.4°F | Ht 67.0 in | Wt 182.0 lb

## 2020-06-02 DIAGNOSIS — E119 Type 2 diabetes mellitus without complications: Secondary | ICD-10-CM

## 2020-06-02 DIAGNOSIS — Z23 Encounter for immunization: Secondary | ICD-10-CM

## 2020-06-02 DIAGNOSIS — E559 Vitamin D deficiency, unspecified: Secondary | ICD-10-CM | POA: Diagnosis not present

## 2020-06-02 DIAGNOSIS — E785 Hyperlipidemia, unspecified: Secondary | ICD-10-CM

## 2020-06-02 DIAGNOSIS — E1169 Type 2 diabetes mellitus with other specified complication: Secondary | ICD-10-CM

## 2020-06-02 LAB — HM DIABETES FOOT EXAM

## 2020-06-02 MED ORDER — PROPRANOLOL HCL ER 80 MG PO CP24
ORAL_CAPSULE | ORAL | 1 refills | Status: DC
Start: 2020-06-02 — End: 2021-01-15

## 2020-06-02 MED ORDER — TRULICITY 1.5 MG/0.5ML ~~LOC~~ SOAJ
1.5000 mg | SUBCUTANEOUS | 3 refills | Status: DC
Start: 2020-06-02 — End: 2021-07-26

## 2020-06-02 NOTE — Assessment & Plan Note (Signed)
Continue high dose supplement for 4 more weeks then changes to  OTC Vit 400 mg BID.

## 2020-06-02 NOTE — Patient Instructions (Addendum)
Set up yearly eye exam for diabetes and have the opthalmologist send [place] a copy of the evaluation for the chart. Get back on track with  regular exercise.     Influenza (Flu) Vaccine (Inactivated or Recombinant): What You Need to Know 1. Why get vaccinated? Influenza vaccine can prevent influenza (flu). Flu is a contagious disease that spreads around the [place] every year, usually between October and May. Anyone can get the flu, but it is more dangerous for some people. Infants and young children, people 40 years of age and older, pregnant women, and people with certain health conditions or a weakened immune system are at greatest risk of flu complications. Pneumonia, bronchitis, sinus infections and ear infections are examples of flu-related complications. If you have a medical condition, such as heart disease, cancer or diabetes, flu can make it worse. Flu can cause fever and chills, sore throat, muscle aches, fatigue, cough, headache, and runny or stuffy nose. Some people may have vomiting and diarrhea, though this is more common in children than adults. Each year thousands of people in the [place] die from flu, and many more are hospitalized. Flu vaccine prevents millions of illnesses and flu-related visits to the doctor each year. 2. Influenza vaccine CDC recommends everyone 40 months of age and older get vaccinated every flu season. Children 6 months through 40 years of age may need 2 doses during a single flu season. Everyone else needs only 1 dose each flu season. It takes about 2 weeks for protection to develop after vaccination. There are many flu viruses, and they are always changing. Each year a new flu vaccine is made to protect against three or four viruses that are likely to cause disease in the upcoming flu season. Even when the vaccine doesn't exactly match these viruses, it may still provide some protection. Influenza vaccine does not cause flu. Influenza vaccine may  be given at the same time as other vaccines. 3. Talk with your health care provider Tell your vaccine provider if the person getting the vaccine:  Has had an allergic reaction after a previous dose of influenza vaccine, or has any severe, life-threatening allergies.  Has ever had Guillain-Barr Syndrome (also called GBS). In some cases, your health care provider may decide to postpone influenza vaccination to a future visit. People with minor illnesses, such as a cold, may be vaccinated. People who are moderately or severely ill should usually wait until they recover before getting influenza vaccine. Your health care provider can give you more information. 4. Risks of a vaccine reaction  Soreness, redness, and swelling where shot is given, fever, muscle aches, and headache can happen after influenza vaccine.  There may be a very small increased risk of Guillain-Barr Syndrome (GBS) after inactivated influenza vaccine (the flu shot). Young children who get the flu shot along with pneumococcal vaccine (PCV13), and/or DTaP vaccine at the same time might be slightly more likely to have a seizure caused by fever. Tell your health care provider if a child who is getting flu vaccine has ever had a seizure. People sometimes faint after medical procedures, including vaccination. Tell your provider if you feel dizzy or have vision changes or ringing in the ears. As with any medicine, there is a very remote chance of a vaccine causing a severe allergic reaction, other serious injury, or death. 5. What if there is a serious problem? An allergic reaction could occur after the vaccinated person leaves the clinic. If you see signs of  a severe allergic reaction (hives, swelling of the face and throat, difficulty breathing, a fast heartbeat, dizziness, or weakness), call 9-1-1 and get the person to the nearest hospital. For other signs that concern you, call your health care provider. Adverse reactions should be  reported to the Vaccine Adverse Event Reporting System (VAERS). Your health care provider will usually file this report, or you can do it yourself. Visit the VAERS website at www.vaers.SamedayNews.es or call 579-436-5824.VAERS is only for reporting reactions, and VAERS staff do not give medical advice. 6. The National Vaccine Injury Compensation Program The [employer] Vaccine Injury Compensation Program (VICP) is a federal program that was created to compensate people who may have been injured by certain vaccines. Visit the VICP website at GoldCloset.com.ee or call 337 827 2600 to learn about the program and about filing a claim. There is a time limit to file a claim for compensation. 7. How can I learn more?  Ask your healthcare provider.  Call your local or state health department.  Contact the Centers for Disease Control and Prevention (CDC): ? Call 678-531-9283 (1-800-CDC-INFO) or ? Visit CDC's https://gibson.com/ Vaccine Information Statement (Interim) Inactivated Influenza Vaccine (04/09/2018) This information is not intended to replace advice given to you by your health care provider. Make sure you discuss any questions you have with your health care provider. Document Revised: 12/01/2018 Document Reviewed: 04/13/2018 Elsevier Patient Education  [place].

## 2020-06-02 NOTE — Assessment & Plan Note (Signed)
Well controlled. Continue current medication.  

## 2020-06-02 NOTE — Progress Notes (Signed)
Chief Complaint  Patient presents with  . Diabetes    home readings fasting have been 90-110.     History of Present Illness: HPI   39 year old female presents for DM follow up.  Diabetes:   Great control on Trulicity.  Has decreased appetite Lab Results  Component Value Date   HGBA1C 6.0 05/29/2020  Using medications without difficulties:none Hypoglycemic episodes: none Hyperglycemic episodes: none Feet problems: no ulcers  Blood Sugars averaging: FBS 90-110 eye exam within last year: Due.  Wt Readings from Last 3 Encounters:  06/02/20 182 lb (82.6 kg)  03/22/20 181 lb 4 oz (82.2 kg)  12/28/19 186 lb 4 oz (84.5 kg)        Elevated Cholesterol: LDL at goal on stain LDL< 932. Lab Results  Component Value Date   CHOL 145 05/29/2020   HDL 39.50 05/29/2020   LDLCALC 88 05/29/2020   LDLDIRECT 146.0 02/04/2017   TRIG 84.0 05/29/2020   CHOLHDL 4 05/29/2020  Using medications without problems: Muscle aches:  Diet compliance: good. Exercise: none in last month.. getting back on track Other complaints:   Vit D Def:  Improved but not at goal... on once a week 50, 000 weekly... for 6 months.  This visit occurred during the SARS-CoV-2 public health emergency.  Safety protocols were in place, including screening questions prior to the visit, additional usage of staff PPE, and extensive cleaning of exam room while observing appropriate contact time as indicated for disinfecting solutions.   COVID 19 screen:  No recent travel or known exposure to COVID19 The patient denies respiratory symptoms of COVID 19 at this time. The importance of social distancing was discussed today.     Review of Systems  Constitutional: Negative for chills and fever.  HENT: Negative for congestion and ear pain.   Eyes: Negative for pain and redness.  Respiratory: Negative for cough and shortness of breath.   Cardiovascular: Negative for chest pain, palpitations and leg swelling.   Gastrointestinal: Negative for abdominal pain, blood in stool, constipation, diarrhea, nausea and vomiting.  Genitourinary: Negative for dysuria.  Musculoskeletal: Negative for falls and myalgias.  Skin: Negative for rash.  Neurological: Negative for dizziness.  Psychiatric/Behavioral: Negative for depression. The patient is not nervous/anxious.       Past Medical History:  Diagnosis Date  . Migraine headache without aura 05/11/2015    reports that she has never smoked. She has never used smokeless tobacco. She reports that she does not drink alcohol and does not use drugs.   Current Outpatient Medications:  .  atorvastatin (LIPITOR) 10 MG tablet, TAKE 1 TABLET BY MOUTH EVERY DAY, Disp: 90 tablet, Rfl: 3 .  Dulaglutide (TRULICITY) 1.5 MG/0.5ML SOPN, Inject 1.5 mg into the skin once a week., Disp: 3 mL, Rfl: 11 .  Multiple Vitamin (MULTIVITAMIN) tablet, Take 1 tablet by mouth daily., Disp: , Rfl:  .  propranolol ER (INDERAL LA) 80 MG 24 hr capsule, TAKE 1 CAPSULE BY MOUTH EVERY DAY (NEED APPT), Disp: 90 capsule, Rfl: 1 .  Vitamin D, Ergocalciferol, (DRISDOL) 1.25 MG (50000 UNIT) CAPS capsule, Take 1 capsule (50,000 Units total) by mouth every 7 (seven) days., Disp: 12 capsule, Rfl: 1   Observations/Objective: Blood pressure 94/62, pulse 83, temperature (!) 97.4 F (36.3 C), temperature source Temporal, height 5\' 7"  (1.702 m), weight 182 lb (82.6 kg), SpO2 97 %, unknown if currently breastfeeding.  Physical Exam Constitutional:      General: She is not in acute distress.  Appearance: Normal appearance. She is well-developed. She is not ill-appearing or toxic-appearing.  HENT:     Head: Normocephalic.     Right Ear: Hearing, tympanic membrane, ear canal and external ear normal. Tympanic membrane is not erythematous, retracted or bulging.     Left Ear: Hearing, tympanic membrane, ear canal and external ear normal. Tympanic membrane is not erythematous, retracted or bulging.     Nose:  No mucosal edema or rhinorrhea.     Right Sinus: No maxillary sinus tenderness or frontal sinus tenderness.     Left Sinus: No maxillary sinus tenderness or frontal sinus tenderness.     Mouth/Throat:     Pharynx: Uvula midline.  Eyes:     General: Lids are normal. Lids are everted, no foreign bodies appreciated.     Conjunctiva/sclera: Conjunctivae normal.     Pupils: Pupils are equal, round, and reactive to light.  Neck:     Thyroid: No thyroid mass or thyromegaly.     Vascular: No carotid bruit.     Trachea: Trachea normal.  Cardiovascular:     Rate and Rhythm: Normal rate and regular rhythm.     Pulses: Normal pulses.     Heart sounds: Normal heart sounds, S1 normal and S2 normal. No murmur heard.  No friction rub. No gallop.   Pulmonary:     Effort: Pulmonary effort is normal. No tachypnea or respiratory distress.     Breath sounds: Normal breath sounds. No decreased breath sounds, wheezing, rhonchi or rales.  Abdominal:     General: Bowel sounds are normal.     Palpations: Abdomen is soft.     Tenderness: There is no abdominal tenderness.  Musculoskeletal:     Cervical back: Normal range of motion and neck supple.  Skin:    General: Skin is warm and dry.     Findings: No rash.  Neurological:     Mental Status: She is alert.  Psychiatric:        Mood and Affect: Mood is not anxious or depressed.        Speech: Speech normal.        Behavior: Behavior normal. Behavior is cooperative.        Thought Content: Thought content normal.        Judgment: Judgment normal.      Diabetic foot exam: Normal inspection No skin breakdown No calluses  Normal DP pulses Normal sensation to light touch and monofilament Nails normal  Assessment and Plan    Vitamin D deficiency Continue high dose supplement for 4 more weeks then changes to  OTC Vit 400 mg BID.  Type 2 diabetes mellitus without complication, without long-term current use of insulin (HCC) Well controlled.  Continue current medication.   Hyperlipidemia associated with type 2 diabetes mellitus (HCC) Well controlled. Continue current medication. Increase exercise to increase HDL.    Kerby Nora, MD

## 2020-06-02 NOTE — Assessment & Plan Note (Signed)
Well controlled. Continue current medication. Increase exercise to increase HDL.

## 2020-06-09 ENCOUNTER — Other Ambulatory Visit: Payer: Self-pay | Admitting: Family Medicine

## 2020-09-05 ENCOUNTER — Other Ambulatory Visit: Payer: Self-pay

## 2020-09-05 ENCOUNTER — Ambulatory Visit (INDEPENDENT_AMBULATORY_CARE_PROVIDER_SITE_OTHER)
Admission: RE | Admit: 2020-09-05 | Discharge: 2020-09-05 | Disposition: A | Discharge status: Home / Self Care | Payer: Self-pay | Source: Ambulatory Visit | Attending: Primary Care | Admitting: Primary Care

## 2020-09-05 ENCOUNTER — Encounter: Payer: Self-pay | Admitting: Primary Care

## 2020-09-05 ENCOUNTER — Ambulatory Visit (INDEPENDENT_AMBULATORY_CARE_PROVIDER_SITE_OTHER): Payer: Self-pay | Admitting: Primary Care

## 2020-09-05 VITALS — BP 116/68 | HR 84 | Temp 97.6°F | Ht 67.0 in | Wt 189.1 lb

## 2020-09-05 DIAGNOSIS — R109 Unspecified abdominal pain: Secondary | ICD-10-CM

## 2020-09-05 LAB — POCT URINALYSIS DIPSTICK
Bilirubin, UA: NEGATIVE
Blood, UA: NEGATIVE
Glucose, UA: POSITIVE — AB
Ketones, UA: NEGATIVE
Leukocytes, UA: NEGATIVE
Nitrite, UA: NEGATIVE
Protein, UA: NEGATIVE
Spec Grav, UA: 1.015 (ref 1.010–1.025)
Urobilinogen, UA: NEGATIVE E.U./dL — AB
pH, UA: 6 (ref 5.0–8.0)

## 2020-09-05 NOTE — Progress Notes (Signed)
Subjective:    Patient ID: Erica Singh, female    DOB: [birth year], 40 y.o.   MRN: [id number]  HPI  This visit occurred during the SARS-CoV-2 public health emergency.  Safety protocols were in place, including screening questions prior to the visit, additional usage of staff PPE, and extensive cleaning of exam room while observing appropriate contact time as indicated for disinfecting solutions.   Erica Singh is a 40 year old female patient of Dr. Ermalene Searing with a history of POTS, migraines, IBS, type 2 diabetes, renal stones who presents today with a chief complaint of flank pain.  Her pain is located to the left flank/lower back and left groin which began about 2 months ago. Pain does not radiate, she has pain in two separate places. Over the last 2 months symptoms her symptoms have been increasing in frequency which are occurring twice on average lasting for 30 minutes each episode. She's noticed that her pain is more noticeable when she's drinking more water.   History of 9 mm obstructive renal stone in 2015, left ureter, unfortunately became septic and was hospitalized, underwent stent placement, then later lithotripsy. She's had no known renal stones since, was told during 2015 that she has a renal stone in the left kidney.   She denies dysuria, hematuria, urinary frequency, fevers, nausea, difficulty urinating.   Review of Systems  Constitutional: Negative for fever.  Gastrointestinal: Negative for abdominal pain.  Genitourinary: Positive for flank pain. Negative for dysuria, frequency, hematuria, urgency and vaginal discharge.       Past Medical History:  Diagnosis Date  . Migraine headache without aura 05/11/2015     Social History   Socioeconomic History  . Marital status: Married    Spouse name: Not on file  . Number of children: Not on file  . Years of education: Not on file  . Highest education level: Not on file  Occupational History  . Not on file   Tobacco Use  . Smoking status: Never Smoker  . Smokeless tobacco: Never Used  Substance and Sexual Activity  . Alcohol use: No    Alcohol/week: 0.0 standard drinks  . Drug use: No  . Sexual activity: Not on file  Other Topics Concern  . Not on file  Social History Narrative  . Not on file   Social Determinants of Health   Financial Resource Strain: Not on file  Food Insecurity: Not on file  Transportation Needs: Not on file  Physical Activity: Not on file  Stress: Not on file  Social Connections: Not on file  Intimate Partner Violence: Not on file    Past Surgical History:  Procedure Laterality Date  . CESAREAN SECTION  2006   Emergency  . TUBAL LIGATION      Family History  Problem Relation Age of Onset  . Hyperlipidemia Mother   . Heart disease Mother        cardiac Cath showed 40 % blockage  . Hyperlipidemia Father     No Known Allergies  Current Outpatient Medications on File Prior to Visit  Medication Sig Dispense Refill  . atorvastatin (LIPITOR) 10 MG tablet TAKE 1 TABLET BY MOUTH EVERY DAY 90 tablet 3  . Dulaglutide (TRULICITY) 1.5 MG/0.5ML SOPN Inject 1.5 mg into the skin once a week. 15 mL 3  . Multiple Vitamin (MULTIVITAMIN) tablet Take 1 tablet by mouth daily.    . propranolol ER (INDERAL LA) 80 MG 24 hr capsule TAKE 1 CAPSULE BY MOUTH EVERY  DAY (NEED APPT) 90 capsule 1  . Vitamin D, Ergocalciferol, (DRISDOL) 1.25 MG (50000 UNIT) CAPS capsule Take 1 capsule (50,000 Units total) by mouth every 7 (seven) days. 12 capsule 1   No current facility-administered medications on file prior to visit.    BP 116/68   Pulse 84   Temp 97.6 F (36.4 C) (Temporal)   Ht 5\' 7"  (1.702 m)   Wt 189 lb 1.9 oz (85.8 kg)   SpO2 98%   BMI 29.62 kg/m    Objective:   Physical Exam Constitutional:      Appearance: She is well-nourished.  Cardiovascular:     Rate and Rhythm: Normal rate.  Pulmonary:     Effort: Pulmonary effort is normal.     Breath sounds:  Normal breath sounds.  Abdominal:     General: Abdomen is flat.     Palpations: Abdomen is soft.     Tenderness: There is no abdominal tenderness. There is no right CVA tenderness or left CVA tenderness.  Musculoskeletal:     Cervical back: Neck supple.  Skin:    General: Skin is warm and dry.  Neurological:     Mental Status: She is alert.  Psychiatric:        Mood and Affect: Mood and affect normal.            Assessment & Plan:

## 2020-09-05 NOTE — Patient Instructions (Signed)
Ensure you are consuming 64 ounces of water daily.  Complete xray(s) prior to leaving today. I will notify you of your results once received.  It was a pleasure meeting you!

## 2020-09-05 NOTE — Assessment & Plan Note (Signed)
Acute for the last 2 months, intermittent, increased frequency in pain. No other urinary symptoms.  UA today with glucose, otherwise negative.   She appears well, no distress.   Checking KUB plain films today. Need to consider CT renal stone study given history, especially if xray negative. She agrees.

## 2020-09-06 ENCOUNTER — Other Ambulatory Visit: Payer: Self-pay | Admitting: Primary Care

## 2020-09-06 DIAGNOSIS — R1032 Left lower quadrant pain: Secondary | ICD-10-CM

## 2020-09-06 DIAGNOSIS — R10A0 Flank pain, unspecified side: Secondary | ICD-10-CM

## 2020-09-06 DIAGNOSIS — R109 Unspecified abdominal pain: Secondary | ICD-10-CM

## 2020-11-16 ENCOUNTER — Telehealth: Payer: Self-pay | Admitting: Family Medicine

## 2020-11-16 DIAGNOSIS — E119 Type 2 diabetes mellitus without complications: Secondary | ICD-10-CM

## 2020-11-16 DIAGNOSIS — E559 Vitamin D deficiency, unspecified: Secondary | ICD-10-CM

## 2020-11-16 NOTE — Telephone Encounter (Signed)
-----   Message from Aquilla Solian, RT sent at 11/06/2020  9:54 AM EDT ----- Regarding: Lab Orders for Friday 4.1.2022 Please place lab orders for Friday 4.1.2022, office visit for physical on Friday 4.8.2022 Thank you, Jones Bales RT(R)

## 2020-11-24 ENCOUNTER — Other Ambulatory Visit: Payer: Self-pay

## 2020-11-24 ENCOUNTER — Other Ambulatory Visit (INDEPENDENT_AMBULATORY_CARE_PROVIDER_SITE_OTHER): Payer: BC Managed Care – PPO

## 2020-11-24 DIAGNOSIS — E119 Type 2 diabetes mellitus without complications: Secondary | ICD-10-CM | POA: Diagnosis not present

## 2020-11-24 DIAGNOSIS — E559 Vitamin D deficiency, unspecified: Secondary | ICD-10-CM | POA: Diagnosis not present

## 2020-11-24 LAB — COMPREHENSIVE METABOLIC PANEL WITH GFR
ALT: 14 U/L (ref 0–35)
AST: 12 U/L (ref 0–37)
Albumin: 4.1 g/dL (ref 3.5–5.2)
Alkaline Phosphatase: 53 U/L (ref 39–117)
BUN: 14 mg/dL (ref 6–23)
CO2: 28 meq/L (ref 19–32)
Calcium: 9.2 mg/dL (ref 8.4–10.5)
Chloride: 104 meq/L (ref 96–112)
Creatinine, Ser: 0.76 mg/dL (ref 0.40–1.20)
GFR: 97.77 mL/min
Glucose, Bld: 100 mg/dL — ABNORMAL HIGH (ref 70–99)
Potassium: 4.5 meq/L (ref 3.5–5.1)
Sodium: 139 meq/L (ref 135–145)
Total Bilirubin: 0.4 mg/dL (ref 0.2–1.2)
Total Protein: 6.7 g/dL (ref 6.0–8.3)

## 2020-11-24 LAB — LIPID PANEL
Cholesterol: 145 mg/dL (ref 0–200)
HDL: 48.2 mg/dL
LDL Cholesterol: 81 mg/dL (ref 0–99)
NonHDL: 96.71
Total CHOL/HDL Ratio: 3
Triglycerides: 79 mg/dL (ref 0.0–149.0)
VLDL: 15.8 mg/dL (ref 0.0–40.0)

## 2020-11-24 LAB — VITAMIN D 25 HYDROXY (VIT D DEFICIENCY, FRACTURES): VITD: 37.86 ng/mL (ref 30.00–100.00)

## 2020-11-24 LAB — HEMOGLOBIN A1C: Hgb A1c MFr Bld: 5.7 % (ref 4.6–6.5)

## 2020-11-24 NOTE — Progress Notes (Signed)
No critical labs need to be addressed urgently. We will discuss labs in detail at upcoming office visit.   

## 2020-12-01 ENCOUNTER — Other Ambulatory Visit (HOSPITAL_COMMUNITY)
Admission: RE | Admit: 2020-12-01 | Discharge: 2020-12-01 | Disposition: A | Discharge status: Home / Self Care | Payer: BC Managed Care – PPO | Source: Ambulatory Visit | Attending: Family Medicine | Admitting: Family Medicine

## 2020-12-01 ENCOUNTER — Ambulatory Visit: Payer: BC Managed Care – PPO | Admitting: Family Medicine

## 2020-12-01 ENCOUNTER — Other Ambulatory Visit: Payer: Self-pay

## 2020-12-01 VITALS — BP 106/70 | HR 67 | Temp 98.0°F | Ht 67.0 in | Wt 191.0 lb

## 2020-12-01 DIAGNOSIS — E785 Hyperlipidemia, unspecified: Secondary | ICD-10-CM

## 2020-12-01 DIAGNOSIS — E119 Type 2 diabetes mellitus without complications: Secondary | ICD-10-CM | POA: Diagnosis not present

## 2020-12-01 DIAGNOSIS — Z0000 Encounter for general adult medical examination without abnormal findings: Secondary | ICD-10-CM

## 2020-12-01 DIAGNOSIS — Z124 Encounter for screening for malignant neoplasm of cervix: Secondary | ICD-10-CM | POA: Diagnosis not present

## 2020-12-01 DIAGNOSIS — E1169 Type 2 diabetes mellitus with other specified complication: Secondary | ICD-10-CM

## 2020-12-01 LAB — MICROALBUMIN / CREATININE URINE RATIO
Creatinine,U: 136.7 mg/dL
Microalb Creat Ratio: 0.6 mg/g (ref 0.0–30.0)
Microalb, Ur: 0.8 mg/dL (ref 0.0–1.9)

## 2020-12-01 LAB — HM MAMMOGRAPHY

## 2020-12-01 NOTE — Assessment & Plan Note (Signed)
Stable, chronic.  Continue current medication.   On trulicity

## 2020-12-01 NOTE — Patient Instructions (Signed)
Keep up great work on healthy eating and regular exercise. 

## 2020-12-01 NOTE — Progress Notes (Signed)
Patient ID: Aniesa Boback, female    DOB: November 08, 1980, 40 y.o.   MRN: [id number]  This visit was conducted in person.  BP 106/70   Pulse 67   Temp 98 F (36.7 C) (Temporal)   Ht 5\' 7"  (1.702 m)   Wt 191 lb (86.6 kg)   LMP 11/01/2019   SpO2 100%   BMI 29.91 kg/m    CC:  Chief Complaint  Patient presents with  . Annual Exam    [facility] in [place]    Subjective:   HPI: Lyndal Alamillo Urvi Imes is a 40 y.o. female presenting on 12/01/2020 for Annual Exam ([facility] in [place])  Elevated Cholesterol:  LDL at goal on atorvastatin. Lab Results  Component Value Date   CHOL 145 11/24/2020   HDL 48.20 11/24/2020   LDLCALC 81 11/24/2020   LDLDIRECT 146.0 02/04/2017   TRIG 79.0 11/24/2020   CHOLHDL 3 11/24/2020  Using medications without problems: none Muscle aches:  none Diet compliance: good Exercise: 3 times a week Other complaints: Wt Readings from Last 3 Encounters:  12/01/20 191 lb (86.6 kg)  09/05/20 189 lb 1.9 oz (85.8 kg)  06/02/20 182 lb (82.6 kg)     VIt D in nml range...  Staying up on D3 1000 IU daily.   Diabetes:   Excellent control on Trulicity. Lab Results  Component Value Date   HGBA1C 5.7 11/24/2020  Using medications without difficulties: Hypoglycemic episodes: Hyperglycemic episodes: Feet problems: Blood Sugars averaging: eye exam within last year:       Relevant past medical, surgical, family and social history reviewed and updated as indicated. Interim medical history since our last visit reviewed. Allergies and medications reviewed and updated. Outpatient Medications Prior to Visit  Medication Sig Dispense Refill  . atorvastatin (LIPITOR) 10 MG tablet TAKE 1 TABLET BY MOUTH EVERY DAY 90 tablet 3  . Cholecalciferol (VITAMIN D3) 50 MCG (2000 UT) TABS Take by mouth.    . Dulaglutide (TRULICITY) 1.5 MG/0.5ML SOPN Inject 1.5 mg into the skin once a week. 15 mL 3  . Multiple Vitamin (MULTIVITAMIN) tablet Take 1  tablet by mouth daily.    . propranolol ER (INDERAL LA) 80 MG 24 hr capsule TAKE 1 CAPSULE BY MOUTH EVERY DAY (NEED APPT) 90 capsule 1  . Vitamin D, Ergocalciferol, (DRISDOL) 1.25 MG (50000 UNIT) CAPS capsule Take 1 capsule (50,000 Units total) by mouth every 7 (seven) days. 12 capsule 1   No facility-administered medications prior to visit.     Per HPI unless specifically indicated in ROS section below Review of Systems  Constitutional: Negative for fatigue and fever.  HENT: Negative for congestion.   Eyes: Negative for pain.  Respiratory: Negative for cough and shortness of breath.   Cardiovascular: Negative for chest pain, palpitations and leg swelling.  Gastrointestinal: Negative for abdominal pain.  Genitourinary: Negative for dysuria and vaginal bleeding.  Musculoskeletal: Negative for back pain.  Neurological: Negative for syncope, light-headedness and headaches.  Psychiatric/Behavioral: Negative for dysphoric mood.   Objective:  BP 106/70   Pulse 67   Temp 98 F (36.7 C) (Temporal)   Ht 5\' 7"  (1.702 m)   Wt 191 lb (86.6 kg)   LMP 11/01/2019   SpO2 100%   BMI 29.91 kg/m   Wt Readings from Last 3 Encounters:  12/01/20 191 lb (86.6 kg)  09/05/20 189 lb 1.9 oz (85.8 kg)  06/02/20 182 lb (82.6 kg)      Physical  Exam Constitutional:      General: She is not in acute distress.    Appearance: Normal appearance. She is well-developed. She is not ill-appearing or toxic-appearing.  HENT:     Head: Normocephalic.     Right Ear: Hearing, tympanic membrane, ear canal and external ear normal. Tympanic membrane is not erythematous, retracted or bulging.     Left Ear: Hearing, tympanic membrane, ear canal and external ear normal. Tympanic membrane is not erythematous, retracted or bulging.     Nose: No mucosal edema or rhinorrhea.     Right Sinus: No maxillary sinus tenderness or frontal sinus tenderness.     Left Sinus: No maxillary sinus tenderness or frontal sinus tenderness.      Mouth/Throat:     Pharynx: Uvula midline.  Eyes:     General: Lids are normal. Lids are everted, no foreign bodies appreciated.     Conjunctiva/sclera: Conjunctivae normal.     Pupils: Pupils are equal, round, and reactive to light.  Neck:     Thyroid: No thyroid mass or thyromegaly.     Vascular: No carotid bruit.     Trachea: Trachea normal.  Cardiovascular:     Rate and Rhythm: Normal rate and regular rhythm.     Pulses: Normal pulses.     Heart sounds: Normal heart sounds, S1 normal and S2 normal. No murmur heard. No friction rub. No gallop.   Pulmonary:     Effort: Pulmonary effort is normal. No tachypnea or respiratory distress.     Breath sounds: Normal breath sounds. No decreased breath sounds, wheezing, rhonchi or rales.  Abdominal:     General: Bowel sounds are normal.     Palpations: Abdomen is soft.     Tenderness: There is no abdominal tenderness.  Musculoskeletal:     Cervical back: Normal range of motion and neck supple.  Skin:    General: Skin is warm and dry.     Findings: No rash.  Neurological:     Mental Status: She is alert.  Psychiatric:        Mood and Affect: Mood is not anxious or depressed.        Speech: Speech normal.        Behavior: Behavior normal. Behavior is cooperative.        Thought Content: Thought content normal.        Judgment: Judgment normal.       Results for orders placed or performed in visit on 11/24/20  VITAMIN D 25 Hydroxy (Vit-D Deficiency, Fractures)  Result Value Ref Range   VITD 37.86 30.00 - 100.00 ng/mL  Comprehensive metabolic panel  Result Value Ref Range   Sodium 139 135 - 145 mEq/L   Potassium 4.5 3.5 - 5.1 mEq/L   Chloride 104 96 - 112 mEq/L   CO2 28 19 - 32 mEq/L   Glucose, Bld 100 (H) 70 - 99 mg/dL   BUN 14 6 - 23 mg/dL   Creatinine, Ser 7.09 0.40 - 1.20 mg/dL   Total Bilirubin 0.4 0.2 - 1.2 mg/dL   Alkaline Phosphatase 53 39 - 117 U/L   AST 12 0 - 37 U/L   ALT 14 0 - 35 U/L   Total Protein 6.7  6.0 - 8.3 g/dL   Albumin 4.1 3.5 - 5.2 g/dL   GFR 62.83 >66.29 mL/min   Calcium 9.2 8.4 - 10.5 mg/dL  Lipid panel  Result Value Ref Range   Cholesterol 145 0 - 200 mg/dL  Triglycerides 79.0 0.0 - 149.0 mg/dL   HDL 02.40 >97.35 mg/dL   VLDL 32.9 0.0 - 92.4 mg/dL   LDL Cholesterol 81 0 - 99 mg/dL   Total CHOL/HDL Ratio 3    NonHDL 96.71   Hemoglobin A1c  Result Value Ref Range   Hgb A1c MFr Bld 5.7 4.6 - 6.5 %    This visit occurred during the SARS-CoV-2 public health emergency.  Safety protocols were in place, including screening questions prior to the visit, additional usage of staff PPE, and extensive cleaning of exam room while observing appropriate contact time as indicated for disinfecting solutions.   COVID 19 screen:  No recent travel or known exposure to COVID19 The patient denies respiratory symptoms of COVID 19 at this time. The importance of social distancing was discussed today.   Assessment and Plan The patient's preventative maintenance and recommended screening tests for an annual wellness exam were reviewed in full today. Brought up to date unless services declined.  Counselled on the importance of diet, exercise, and its role in overall health and mortality. The patient's FH and SH was reviewed, including their home life, tobacco status, and drug and alcohol status.   PAP/DVE: per GYN. not due for mammograms: sister with Breast cancer age 38.. Start mammo at 39.  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 Vaccines:uptodate, flu at work, started [employer] series, Set up for mammogram today. Nonsmoker STD screen: refused. No early family history of colon cancer.   Problem List Items Addressed This Visit    Hyperlipidemia associated with type 2 diabetes mellitus (HCC)    Stable, chronic.  Continue current medication.   Atorvastatin 10 mg daily      Type 2 diabetes mellitus without complication, without long-term current use of insulin (HCC)     Stable, chronic.  Continue current medication.   On trulicity      Relevant Orders   Microalbumin / creatinine urine ratio (Completed)    Other Visit Diagnoses    Routine general medical examination at a health care facility    -  Primary   Screening for cervical cancer       Relevant Orders   Cytology - PAP([place]) (Completed)       Kerby Nora, MD

## 2020-12-01 NOTE — Assessment & Plan Note (Signed)
Stable, chronic.  Continue current medication.   Atorvastatin 10 mg daily 

## 2020-12-05 LAB — CYTOLOGY - PAP
Adequacy: ABSENT
Comment: NEGATIVE
High risk HPV: NEGATIVE

## 2020-12-07 ENCOUNTER — Other Ambulatory Visit: Payer: Self-pay | Admitting: Family Medicine

## 2020-12-07 DIAGNOSIS — R87612 Low grade squamous intraepithelial lesion on cytologic smear of cervix (LGSIL): Secondary | ICD-10-CM

## 2020-12-11 ENCOUNTER — Encounter: Payer: Self-pay | Admitting: Family Medicine

## 2020-12-14 ENCOUNTER — Encounter: Payer: Self-pay | Admitting: Family Medicine

## 2021-01-14 ENCOUNTER — Other Ambulatory Visit: Payer: Self-pay | Admitting: Family Medicine

## 2021-01-22 ENCOUNTER — Other Ambulatory Visit: Payer: Self-pay | Admitting: Family Medicine

## 2021-01-24 NOTE — Telephone Encounter (Signed)
Pharmacy requests refill on: Atorvastatin 10 mg   LAST REFILL: 12/27/2019 (Q-90, R-3) LAST OV: 12/01/2020 NEXT OV: 06/01/2021 PHARMACY: [id number] [place], [place]

## 2021-05-14 ENCOUNTER — Telehealth: Payer: Self-pay | Admitting: Family Medicine

## 2021-05-14 DIAGNOSIS — E559 Vitamin D deficiency, unspecified: Secondary | ICD-10-CM

## 2021-05-14 DIAGNOSIS — E119 Type 2 diabetes mellitus without complications: Secondary | ICD-10-CM

## 2021-05-14 NOTE — Telephone Encounter (Signed)
-----   Message from Alvina Chou sent at 05/10/2021  8:27 AM EDT ----- Regarding: Lab orders for Friday, 9.30.22 Lab orders for a 6 month follow up appt

## 2021-05-25 ENCOUNTER — Other Ambulatory Visit: Payer: BC Managed Care – PPO

## 2021-06-01 ENCOUNTER — Ambulatory Visit: Payer: BC Managed Care – PPO | Admitting: Family Medicine

## 2021-07-26 ENCOUNTER — Telehealth: Payer: Self-pay | Admitting: Family Medicine

## 2021-07-26 NOTE — Telephone Encounter (Signed)
Called mrs. Erica Singh and lvmtcb to scheduled a DM

## 2021-07-26 NOTE — Telephone Encounter (Signed)
Please schedule patient for Diabetes follow up with Dr. Ermalene Searing.  Looks like she no showed her last appointment.

## 2021-07-27 NOTE — Telephone Encounter (Signed)
Called patient and lvmtcb

## 2021-07-31 ENCOUNTER — Other Ambulatory Visit: Payer: Self-pay | Admitting: Family Medicine
